# Patient Record
Sex: Male | Born: 1960 | Race: White | Hispanic: No | Marital: Married | Smoking: Never smoker
Health system: Southern US, Community
[De-identification: ages and names within clinical notes are randomized; demographics above are authoritative.]

## PROBLEM LIST (undated history)

## (undated) DIAGNOSIS — J329 Chronic sinusitis, unspecified: Secondary | ICD-10-CM

## (undated) DIAGNOSIS — K5720 Diverticulitis of large intestine with perforation and abscess without bleeding: Secondary | ICD-10-CM

## (undated) HISTORY — PX: RHINOPLASTY: SUR1284

## (undated) HISTORY — DX: Diverticulitis of large intestine with perforation and abscess without bleeding: K57.20

## (undated) HISTORY — PX: APPENDECTOMY: SHX54

## (undated) HISTORY — PX: INGUINAL HERNIA REPAIR: SUR1180

---

## 1997-08-18 ENCOUNTER — Other Ambulatory Visit: Admission: RE | Admit: 1997-08-18 | Discharge: 1997-08-18 | Payer: Self-pay | Admitting: Dermatology

## 1998-10-13 ENCOUNTER — Encounter (INDEPENDENT_AMBULATORY_CARE_PROVIDER_SITE_OTHER): Payer: Self-pay | Admitting: Specialist

## 1998-10-13 ENCOUNTER — Other Ambulatory Visit: Admission: RE | Admit: 1998-10-13 | Discharge: 1998-10-13 | Payer: Self-pay | Admitting: *Deleted

## 1999-02-14 HISTORY — PX: VASECTOMY: SHX75

## 2000-04-06 ENCOUNTER — Other Ambulatory Visit: Admission: RE | Admit: 2000-04-06 | Discharge: 2000-04-06 | Payer: Self-pay | Admitting: Urology

## 2000-04-06 ENCOUNTER — Encounter (INDEPENDENT_AMBULATORY_CARE_PROVIDER_SITE_OTHER): Payer: Self-pay | Admitting: Specialist

## 2002-07-25 ENCOUNTER — Encounter (INDEPENDENT_AMBULATORY_CARE_PROVIDER_SITE_OTHER): Payer: Self-pay | Admitting: *Deleted

## 2002-07-25 ENCOUNTER — Ambulatory Visit (HOSPITAL_BASED_OUTPATIENT_CLINIC_OR_DEPARTMENT_OTHER): Admission: RE | Admit: 2002-07-25 | Discharge: 2002-07-25 | Payer: Self-pay | Admitting: General Surgery

## 2003-10-27 ENCOUNTER — Encounter: Admission: RE | Admit: 2003-10-27 | Discharge: 2003-10-27 | Payer: Self-pay | Admitting: Internal Medicine

## 2007-08-18 ENCOUNTER — Encounter: Admission: RE | Admit: 2007-08-18 | Discharge: 2007-08-18 | Payer: Self-pay | Admitting: Orthopedic Surgery

## 2009-01-29 ENCOUNTER — Ambulatory Visit: Payer: Self-pay | Admitting: Diagnostic Radiology

## 2009-01-29 ENCOUNTER — Emergency Department (HOSPITAL_BASED_OUTPATIENT_CLINIC_OR_DEPARTMENT_OTHER): Admission: EM | Admit: 2009-01-29 | Discharge: 2009-01-29 | Payer: Self-pay | Admitting: Emergency Medicine

## 2010-05-11 ENCOUNTER — Other Ambulatory Visit: Payer: Self-pay | Admitting: Family Medicine

## 2010-05-11 ENCOUNTER — Ambulatory Visit
Admission: RE | Admit: 2010-05-11 | Discharge: 2010-05-11 | Disposition: A | Discharge status: Home / Self Care | Payer: BC Managed Care – PPO | Source: Ambulatory Visit | Attending: Family Medicine | Admitting: Family Medicine

## 2010-05-11 MED ORDER — IOHEXOL 300 MG/ML  SOLN
125.0000 mL | Freq: Once | INTRAMUSCULAR | Status: AC | PRN
  Administered 2010-05-11: 125 mL via INTRAVENOUS

## 2010-07-01 NOTE — Op Note (Signed)
Paul Sloan, Paul Sloan                   ACCOUNT NO.:  [id number]   MEDICAL RECORD NO.:  [id number]                   PATIENT TYPE:  AMB   LOCATION:  [facility]                                 FACILITY:  [facility]   PHYSICIAN:  Valetta Fuller, M.D.               DATE OF BIRTH:  04-19-1960   DATE OF PROCEDURE:  07/25/2002  DATE OF DISCHARGE:                                 OPERATIVE REPORT   PREOPERATIVE DIAGNOSIS:  Left spermatocele.   POSTOPERATIVE DIAGNOSIS:  Left spermatocele.   PROCEDURE:  Excision of left spermatocele.   SURGEON:  Valetta Fuller, M.D.   ASSISTANT:  Melvyn Novas, M.D.   ANESTHESIA:  General.   INDICATIONS FOR PROCEDURE:  The patient has had a diagnosis of a large  spermatocele for several years. He  initially came in and this was  evaluated. At this time it was relatively asymptomatic to him and we  recommended observation. He has felt that this has increased in size and is  causing him some discomfort on occasion. The patient has been diagnosed with  a recurrent inguina hernia and is seeing Dr. Leonie Man. He was to  undergo left hernia repair and wondered if the spermatocele could be excised  at the same time. We talked about  the pros and cons of this as well as the  potential complications and issues. He elected to have this done at the same  time.   DESCRIPTION OF PROCEDURE:  When we entered the operating room the patient  had had successful completion of his recurrent left inguinal hernia repair.  His scrotum was already prepped and draped in the usual manner.   A small incision in the median raphe was performed. The left scrotal  compartment was entered and a small  amount of hydrocele fluid was obtained.  The testicle was then brought out the incision. The testicle was palpably  normal. In the head of the epididymus was a 4 to 5-cm tense cystic mass with  cloudy fluid consistent with a spermatocele.   Utilizing a combination of blunt and  sharp dissective technique along with  electrocautery, we were able to separate this cyst from the surrounding  epididymus without difficulty. This was a large but unilocular cyst. Again  it contained approximately 20 to 25 cc of milky fluid. Hemostasis was quite  good. At the completion of the procedure the testicle was returned to the  hemiscrotum utilizing care to make sure it was not twisted. A spermatic cord  block was performed. The dartos was closed with some 3-0 Vicryl and the skin  was closed with a running 4-0 Vicryl.   The patient appeared  to tolerate  the procedure well. There were no obvious  complications.  Valetta Fuller, M.D.    DSG/MEDQ  D:  07/25/2002  T:  07/26/2002  Job:  [id number]

## 2010-07-01 NOTE — Op Note (Signed)
NAMEAARYN, [name]                   ACCOUNT NO.:  [id number]   MEDICAL RECORD NO.:  [id number]                   PATIENT TYPE:  AMB   LOCATION:  [facility]                                 FACILITY:  [facility]   PHYSICIAN:  Leonie Man, M.D.                DATE OF BIRTH:  1960-09-30   DATE OF PROCEDURE:  07/25/2002  DATE OF DISCHARGE:                                 OPERATIVE REPORT   PREOPERATIVE DIAGNOSIS:  Recurrent left inguinal hernia.   POSTOPERATIVE DIAGNOSIS:  Recurrent left inguinal hernia.   PROCEDURE:  Repair recurrent left inguinal hernia.   SURGEON:  Leonie Man, M.D.   ASSISTANT:  Joanne Gavel, M.D.   ANESTHESIA:  General.   INDICATIONS FOR PROCEDURE:  The patient is a 50 year old man who is status  post left inguinal hernia repair in the remote past using a Shouldice  technique. He has had a recurrence now for a few months and comes to the  operating room for repair after the risks and potential benefits of surgery  had been fully discussed  and consent obtained.   DESCRIPTION OF PROCEDURE:  Following induction of satisfactory general  anesthesia the patient was positioned supinely and the lower abdomen is  prepped and draped to be included in the sterile operative field. I  infiltrated in the left lower groin crease with 0.5% Marcaine with  epinephrine 1:200,000.   I made a transverse incision through the old scar cicatrix, deepening this  through the skin and subcutaneous tissues down through the external oblique  aponeurosis. The external oblique aponeurosis was opened up through the  external inguinal ring. The area felt to contain the ilioinguinal nerve was  protected and retracted laterally and inferiorly. The spermatic cord was  then dissected free from the surrounding tissues and held with a Penrose  drain.   Dissecting along the spermatic cord did not reveal an indirect hernia. There  was a very large direct hernia recurrence noted. The sutures  from the old  repair were removed, and the direct hernia was repaired with an onlay patch  of polypropylene mesh, zoning at the pubic tubercle  with a 2-0 Novofil  suture and then continued up on the conjoint tendon with a running suture of  2-0 Novofil up to the internal ring, and again from the pubic tubercle up  along the shelving edge of Poupart's ligament up to the internal ring with  the mesh being split so as to accommodate the cord protruding through the  split. The tails of the mesh were then trimmed and sutured into the internal  oblique muscle behind the cord.   The repair was then checked and noted to be intact. Sponge instrument and  sharp counts were verified. All areas of dissection were checked for  hemostatis and noted to be dry. The cord was returned to its normal anatomic  position and the external oblique aponeurosis was closed with a running  suture  of 2-0 Vicryl, thus reapproximating the external inguinal ring. The  subcutaneous tissues and Scarpa's fascia  were closed with a running suture  of 3-0 Vicryl and the skin was closed with a running 4-0 Monocryl. The wound  was then reinforced with Steri-Strips.   At this point Dr. Isabel Caprice entered the room as was planned prior to this to  perform a scrotal procedure for a spermatocele. That dictation will be in a  separate document.                                               Leonie Man, M.D.    PB/MEDQ  D:  07/25/2002  T:  07/26/2002  Job:  [id number]

## 2010-09-02 ENCOUNTER — Other Ambulatory Visit: Payer: Self-pay | Admitting: Gastroenterology

## 2011-06-18 ENCOUNTER — Encounter (HOSPITAL_COMMUNITY): Admission: EM | Disposition: A | Discharge status: Home / Self Care | Payer: Self-pay | Source: Home / Self Care

## 2011-06-18 ENCOUNTER — Emergency Department (HOSPITAL_COMMUNITY): Payer: BC Managed Care – PPO

## 2011-06-18 ENCOUNTER — Encounter (HOSPITAL_COMMUNITY): Payer: Self-pay

## 2011-06-18 ENCOUNTER — Encounter (HOSPITAL_COMMUNITY): Payer: Self-pay | Admitting: Anesthesiology

## 2011-06-18 ENCOUNTER — Inpatient Hospital Stay (HOSPITAL_COMMUNITY)
Admission: EM | Admit: 2011-06-18 | Discharge: 2011-06-23 | DRG: 883 | Disposition: A | Discharge status: Home / Self Care | Payer: BC Managed Care – PPO | Attending: General Surgery | Admitting: General Surgery

## 2011-06-18 ENCOUNTER — Emergency Department (HOSPITAL_COMMUNITY): Payer: BC Managed Care – PPO | Admitting: Anesthesiology

## 2011-06-18 DIAGNOSIS — R000 Tachycardia, unspecified: Secondary | ICD-10-CM | POA: Diagnosis present

## 2011-06-18 DIAGNOSIS — Z6831 Body mass index (BMI) 31.0-31.9, adult: Secondary | ICD-10-CM

## 2011-06-18 DIAGNOSIS — E669 Obesity, unspecified: Secondary | ICD-10-CM | POA: Diagnosis present

## 2011-06-18 DIAGNOSIS — K631 Perforation of intestine (nontraumatic): Secondary | ICD-10-CM

## 2011-06-18 DIAGNOSIS — K5720 Diverticulitis of large intestine with perforation and abscess without bleeding: Secondary | ICD-10-CM | POA: Diagnosis present

## 2011-06-18 DIAGNOSIS — K389 Disease of appendix, unspecified: Secondary | ICD-10-CM

## 2011-06-18 DIAGNOSIS — K37 Unspecified appendicitis: Principal | ICD-10-CM | POA: Diagnosis present

## 2011-06-18 DIAGNOSIS — K5732 Diverticulitis of large intestine without perforation or abscess without bleeding: Secondary | ICD-10-CM | POA: Diagnosis present

## 2011-06-18 HISTORY — PX: LAPAROSCOPY: SHX197

## 2011-06-18 LAB — URINE MICROSCOPIC-ADD ON

## 2011-06-18 LAB — URINALYSIS, ROUTINE W REFLEX MICROSCOPIC
Protein, ur: NEGATIVE mg/dL
Specific Gravity, Urine: 1.014 (ref 1.005–1.030)
Urobilinogen, UA: 0.2 mg/dL (ref 0.0–1.0)

## 2011-06-18 LAB — COMPREHENSIVE METABOLIC PANEL
AST: 19 U/L (ref 0–37)
Alkaline Phosphatase: 75 U/L (ref 39–117)
CO2: 23 mEq/L (ref 19–32)
Calcium: 9.5 mg/dL (ref 8.4–10.5)
Chloride: 94 mEq/L — ABNORMAL LOW (ref 96–112)
Glucose, Bld: 123 mg/dL — ABNORMAL HIGH (ref 70–99)
Potassium: 3.4 mEq/L — ABNORMAL LOW (ref 3.5–5.1)
Sodium: 130 mEq/L — ABNORMAL LOW (ref 135–145)
Total Protein: 7.6 g/dL (ref 6.0–8.3)

## 2011-06-18 LAB — CBC
HCT: 43.3 % (ref 39.0–52.0)
Hemoglobin: 15.2 g/dL (ref 13.0–17.0)
RBC: 4.8 MIL/uL (ref 4.22–5.81)
WBC: 22.7 10*3/uL — ABNORMAL HIGH (ref 4.0–10.5)

## 2011-06-18 SURGERY — LAPAROSCOPY, DIAGNOSTIC
Anesthesia: General | Site: Abdomen | Wound class: Dirty or Infected

## 2011-06-18 MED ORDER — HYDROMORPHONE HCL PF 1 MG/ML IJ SOLN
1.0000 mg | Freq: Once | INTRAMUSCULAR | Status: AC
  Administered 2011-06-18: 1 mg via INTRAVENOUS
  Filled 2011-06-18: qty 1

## 2011-06-18 MED ORDER — MIDAZOLAM HCL 5 MG/5ML IJ SOLN
INTRAMUSCULAR | Status: DC | PRN
  Administered 2011-06-18: 2 mg via INTRAVENOUS

## 2011-06-18 MED ORDER — FENTANYL CITRATE 0.05 MG/ML IJ SOLN
INTRAMUSCULAR | Status: DC | PRN
  Administered 2011-06-18: 50 ug via INTRAVENOUS
  Administered 2011-06-18: 100 ug via INTRAVENOUS

## 2011-06-18 MED ORDER — LACTATED RINGERS IV SOLN
INTRAVENOUS | Status: DC | PRN
  Administered 2011-06-18 (×2): via INTRAVENOUS

## 2011-06-18 MED ORDER — BUPIVACAINE HCL (PF) 0.5 % IJ SOLN
INTRAMUSCULAR | Status: AC
  Filled 2011-06-18: qty 30

## 2011-06-18 MED ORDER — PROPOFOL 10 MG/ML IV EMUL
INTRAVENOUS | Status: DC | PRN
  Administered 2011-06-18: 200 mg via INTRAVENOUS

## 2011-06-18 MED ORDER — BUPIVACAINE HCL (PF) 0.5 % IJ SOLN
INTRAMUSCULAR | Status: DC | PRN
  Administered 2011-06-18: 30 mL

## 2011-06-18 MED ORDER — IOHEXOL 300 MG/ML  SOLN
100.0000 mL | Freq: Once | INTRAMUSCULAR | Status: AC | PRN
  Administered 2011-06-18: 100 mL via INTRAVENOUS

## 2011-06-18 MED ORDER — ONDANSETRON HCL 4 MG/2ML IJ SOLN
4.0000 mg | Freq: Once | INTRAMUSCULAR | Status: AC
  Administered 2011-06-18: 4 mg via INTRAVENOUS
  Filled 2011-06-18: qty 2

## 2011-06-18 MED ORDER — SODIUM CHLORIDE 0.9 % IV SOLN
500.0000 mg | Freq: Once | INTRAVENOUS | Status: AC
  Administered 2011-06-18: 500 mg via INTRAVENOUS
  Filled 2011-06-18: qty 500

## 2011-06-18 MED ORDER — CISATRACURIUM BESYLATE 2 MG/ML IV SOLN
INTRAVENOUS | Status: DC | PRN
  Administered 2011-06-18: 4 mg via INTRAVENOUS
  Administered 2011-06-18: 6 mg via INTRAVENOUS
  Administered 2011-06-19: 3 mg via INTRAVENOUS

## 2011-06-18 MED ORDER — LACTATED RINGERS IV SOLN
INTRAVENOUS | Status: DC | PRN
  Administered 2011-06-18: 1000 mL via INTRAVENOUS

## 2011-06-18 MED ORDER — SUCCINYLCHOLINE CHLORIDE 20 MG/ML IJ SOLN
INTRAMUSCULAR | Status: DC | PRN
  Administered 2011-06-18: 100 mg via INTRAVENOUS

## 2011-06-18 SURGICAL SUPPLY — 39 items
BAG RETRIEVAL 10 (BASKET) ×1
BENZOIN TINCTURE PRP APPL 2/3 (GAUZE/BANDAGES/DRESSINGS) ×2 IMPLANT
CANISTER SUCTION 2500CC (MISCELLANEOUS) ×2 IMPLANT
CANNULA ENDOPATH XCEL 11M (ENDOMECHANICALS) IMPLANT
CLOTH BEACON ORANGE TIMEOUT ST (SAFETY) ×2 IMPLANT
DECANTER SPIKE VIAL GLASS SM (MISCELLANEOUS) IMPLANT
DEVICE TROCAR PUNCTURE CLOSURE (ENDOMECHANICALS) ×2 IMPLANT
DRAIN CHANNEL 19F RND (DRAIN) ×4 IMPLANT
DRAPE LAPAROSCOPIC ABDOMINAL (DRAPES) ×2 IMPLANT
DRSG TEGADERM 2-3/8X2-3/4 SM (GAUZE/BANDAGES/DRESSINGS) ×6 IMPLANT
ELECT REM PT RETURN 9FT ADLT (ELECTROSURGICAL) ×2
ELECTRODE REM PT RTRN 9FT ADLT (ELECTROSURGICAL) ×1 IMPLANT
EVACUATOR SILICONE 100CC (DRAIN) ×4 IMPLANT
GAUZE SPONGE 2X2 8PLY STRL LF (GAUZE/BANDAGES/DRESSINGS) ×1 IMPLANT
GLOVE BIOGEL PI IND STRL 7.0 (GLOVE) ×1 IMPLANT
GLOVE BIOGEL PI INDICATOR 7.0 (GLOVE) ×1
GLOVE ECLIPSE 8.0 STRL XLNG CF (GLOVE) ×2 IMPLANT
GLOVE INDICATOR 8.0 STRL GRN (GLOVE) ×4 IMPLANT
GOWN STRL NON-REIN LRG LVL3 (GOWN DISPOSABLE) ×2 IMPLANT
GOWN STRL REIN XL XLG (GOWN DISPOSABLE) ×4 IMPLANT
KIT BASIN OR (CUSTOM PROCEDURE TRAY) ×2 IMPLANT
SCALPEL HARMONIC ACE (MISCELLANEOUS) ×2 IMPLANT
SET IRRIG TUBING LAPAROSCOPIC (IRRIGATION / IRRIGATOR) ×2 IMPLANT
SOLUTION ANTI FOG 6CC (MISCELLANEOUS) ×2 IMPLANT
SPONGE DRAIN TRACH 4X4 STRL 2S (GAUZE/BANDAGES/DRESSINGS) ×2 IMPLANT
SPONGE GAUZE 2X2 STER 10/PKG (GAUZE/BANDAGES/DRESSINGS) ×1
STRIP CLOSURE SKIN 1/2X4 (GAUZE/BANDAGES/DRESSINGS) ×2 IMPLANT
SUT ETHILON 3 0 PS 1 (SUTURE) ×4 IMPLANT
SUT MNCRL AB 4-0 PS2 18 (SUTURE) ×2 IMPLANT
SUT VIC AB 4-0 PS2 27 (SUTURE) ×2 IMPLANT
SYS BAG RETRIEVAL 10MM (BASKET) ×1
SYSTEM BAG RETRIEVAL 10MM (BASKET) ×1 IMPLANT
TOWEL OR 17X26 10 PK STRL BLUE (TOWEL DISPOSABLE) ×2 IMPLANT
TRAY FOLEY CATH 14FRSI W/METER (CATHETERS) IMPLANT
TRAY LAP CHOLE (CUSTOM PROCEDURE TRAY) ×2 IMPLANT
TROCAR BLADELESS OPT 5 75 (ENDOMECHANICALS) ×8 IMPLANT
TROCAR XCEL BLUNT TIP 100MML (ENDOMECHANICALS) ×2 IMPLANT
TROCAR XCEL NON-BLD 11X100MML (ENDOMECHANICALS) IMPLANT
TUBING INSUFFLATION 10FT LAP (TUBING) ×2 IMPLANT

## 2011-06-18 NOTE — ED Notes (Signed)
Pt in from home with abd pain was seen at [facility] walk in and was referred to r/o appendicitis pt states pain onset two days ago denies vomiting at present states some nausea

## 2011-06-18 NOTE — ED Notes (Signed)
Pt transported to OR by Gerilyn Pilgrim NT

## 2011-06-18 NOTE — ED Provider Notes (Signed)
History     CSN: [id number]  Arrival date & time 06/18/11  1812   First MD Initiated Contact with Patient 06/18/11 1849      Chief Complaint  Patient presents with  . Abdominal Pain    (Consider location/radiation/quality/duration/timing/severity/associated sxs/prior treatment) The history is provided by the patient, the spouse and medical records.  Healthy 51 y/o M presents to ED after referral from [facility] walk-in clinic with c/c of acute onset RLQ pain 2 days ago. Pain severe, non-radiating, sharp. Assoc with fever up to 102, N/V, anorexia. Denies change in bowel habits. Prior abd surgeries include multiple inguinal hernia repairs. Pt was found at walk-in clinic to have WBC count >23k and was advised to present to ED to r/o appendicitis. Palpation and movement make pain worse, nothing makes it better.  History reviewed. No pertinent past medical history.  History reviewed. No pertinent past surgical history.  No family history on file.  History  Substance Use Topics  . Smoking status: Never Smoker   . Smokeless tobacco: Not on file  . Alcohol Use: Yes      Review of Systems 10 systems reviewed and are negative for acute change except as noted in the HPI.  Allergies  Review of patient's allergies indicates no known allergies.  Home Medications   Current Outpatient Rx  Name Route Sig Dispense Refill  . LORATADINE 10 MG PO CAPS Oral Take 10 mg by mouth daily.      BP 149/85  Pulse 119  Temp(Src) 99.7 F (37.6 C) (Oral)  Resp 16  SpO2 97%  Physical Exam  Constitutional: He is oriented to person, place, and time. He appears well-developed and well-nourished.       VS reviewed, sig for tachycardia. Pt uncomfortable appearing  HENT:  Head: Normocephalic and atraumatic.  Mouth/Throat: Oropharynx is clear and moist.  Eyes: Conjunctivae are normal. Pupils are equal, round, and reactive to light.  Neck: Neck supple.  Cardiovascular: Regular rhythm and normal heart  sounds.  Tachycardia present.   Pulmonary/Chest: Effort normal and breath sounds normal. No respiratory distress. He has no wheezes. He exhibits no tenderness.  Abdominal: Soft. Bowel sounds are normal. There is no rebound.       Obese, soft, TTP RLQ, referred pain to RLQ with palpation of the LLQ  Musculoskeletal: He exhibits no edema and no tenderness.  Neurological: He is alert and oriented to person, place, and time.  Skin: Skin is warm and dry.    ED Course  Procedures (including critical care time)   Labs Reviewed  CBC  COMPREHENSIVE METABOLIC PANEL  URINALYSIS, ROUTINE W REFLEX MICROSCOPIC   No results found.   No diagnosis found.    MDM  7:05 PM Pt has been seen and evaluated. Hx and PE concerning for acute appendicitis. CT scan, labs ordered. Pain/nausea medication ordered. Will continue to monitor.      8:09 PM Pt care has been discussed with NP Manus Rudd, who will continue to monitor and follow-up on testing results.  Shaaron Adler, PA-C 06/18/11 2010

## 2011-06-18 NOTE — H&P (Addendum)
Paul Sloan is an 51 y.o. male.   Chief Complaint: Abdominal pain and fever HPI: He was in his normal state of health until yesterday after lunch when he developed the sudden onset of lower abdominal pain.  The pain was persistent and was associated with a fever as high as 102. He presented to the walk-in clinic and there was concern for acute appendicitis as he had a fever of 101 and an elevation of his white blood cell count. He was sent to the emergency department where he underwent a CT scan that demonstrated significant amount of free intraperitoneal air as well as significant inflammatory changes of the sigmoid colon consistent with diverticulitis with perforation. The appendix was dilated as well with inflammatory changes around it  felt to be secondary inflammatory changes. There is a small amount of free fluid around the sigmoid colon. I was asked to see him at this time. He's been started on IV Primaxin. He does have a previous history of diverticulitis.  PMH:  Sigmoid diverticulitis  PSH: BIH repair   No family history on file. Social History:  reports that he has never smoked. He does not have any smokeless tobacco history on file. He reports that he drinks alcohol. His drug history not on file.  Allergies: No Known Allergies  Prior to Admission medications   Medication Sig Start Date End Date Taking? Authorizing Provider  Loratadine (CLARITIN) 10 MG CAPS Take 10 mg by mouth daily.   Yes Historical Provider, MD     (Not in a hospital admission)  Results for orders placed during the hospital encounter of 06/18/11 (from the past 48 hour(s))  CBC     Status: Abnormal   Collection Time   06/18/11  7:45 PM      Component Value Range Comment   WBC 22.7 (*) 4.0 - 10.5 (K/uL)    RBC 4.80  4.22 - 5.81 (MIL/uL)    Hemoglobin 15.2  13.0 - 17.0 (g/dL)    HCT 16.1  09.6 - 04.5 (%)    MCV 90.2  78.0 - 100.0 (fL)    MCH 31.7  26.0 - 34.0 (pg)    MCHC 35.1  30.0 - 36.0 (g/dL)      RDW 40.9  81.1 - 15.5 (%)    Platelets 267  150 - 400 (K/uL)   COMPREHENSIVE METABOLIC PANEL     Status: Abnormal   Collection Time   06/18/11  7:45 PM      Component Value Range Comment   Sodium 130 (*) 135 - 145 (mEq/L)    Potassium 3.4 (*) 3.5 - 5.1 (mEq/L)    Chloride 94 (*) 96 - 112 (mEq/L)    CO2 23  19 - 32 (mEq/L)    Glucose, Bld 123 (*) 70 - 99 (mg/dL)    BUN 9  6 - 23 (mg/dL)    Creatinine, Ser 9.14  0.50 - 1.35 (mg/dL)    Calcium 9.5  8.4 - 10.5 (mg/dL)    Total Protein 7.6  6.0 - 8.3 (g/dL)    Albumin 3.9  3.5 - 5.2 (g/dL)    AST 19  0 - 37 (U/L)    ALT 31  0 - 53 (U/L)    Alkaline Phosphatase 75  39 - 117 (U/L)    Total Bilirubin 1.7 (*) 0.3 - 1.2 (mg/dL)    GFR calc non Af Amer >90  >90 (mL/min)    GFR calc Af Amer >90  >90 (mL/min)  URINALYSIS, ROUTINE W REFLEX MICROSCOPIC     Status: Abnormal   Collection Time   06/18/11  8:13 PM      Component Value Range Comment   Color, Urine YELLOW  YELLOW     APPearance CLEAR  CLEAR     Specific Gravity, Urine 1.014  1.005 - 1.030     pH 6.5  5.0 - 8.0     Glucose, UA NEGATIVE  NEGATIVE (mg/dL)    Hgb urine dipstick SMALL (*) NEGATIVE     Bilirubin Urine NEGATIVE  NEGATIVE     Ketones, ur TRACE (*) NEGATIVE (mg/dL)    Protein, ur NEGATIVE  NEGATIVE (mg/dL)    Urobilinogen, UA 0.2  0.0 - 1.0 (mg/dL)    Nitrite NEGATIVE  NEGATIVE     Leukocytes, UA NEGATIVE  NEGATIVE    URINE MICROSCOPIC-ADD ON     Status: Normal   Collection Time   06/18/11  8:13 PM      Component Value Range Comment   RBC / HPF 0-2  <3 (RBC/hpf)    Ct Abdomen Pelvis W Contrast  06/18/2011  *RADIOLOGY REPORT*  Clinical Data: Acute onset of right lower quadrant abdominal pain, fever and vomiting; significant leukocytosis.  CT ABDOMEN AND PELVIS WITH CONTRAST  Technique:  Multidetector CT imaging of the abdomen and pelvis was performed following the standard protocol during bolus administration of intravenous contrast.  Contrast: OMNIPAQUE IOHEXOL 300  MG/ML  SOLN  Comparison: CT of the abdomen and pelvis performed 05/11/2010  Findings: The visualized lung bases are clear.  There is a significant amount of free air noted within the abdomen, predominantly anteriorly and tracking about the liver and stomach, though minimal scattered air is seen along the mesentery and omentum.  This arises from a perforated diverticulitis; a significant amount of soft tissue inflammation is noted about the proximal to mid sigmoid colon, with minimal associated fluid.  Associated soft tissue inflammation and trace free air tracks superiorly along the mesentery and adjacent small bowel loops.  The liver and spleen are unremarkable in appearance.  A few tiny stones are seen layering dependently within the gallbladder; the gallbladder is otherwise unremarkable in appearance.  The pancreas and adrenal glands are unremarkable.  Nonspecific perinephric stranding is noted bilaterally.  A small 1.5 cm exophytic cyst at the upper pole of the right kidney appears stable from 2012.  The kidneys are otherwise grossly unremarkable in appearance.  There is no evidence of hydronephrosis.  No renal or ureteral stones are seen.  No free fluid is identified.  The small bowel is unremarkable in appearance.  The stomach is within normal limits.  No acute vascular abnormalities are seen.  Minimal calcification is noted at the aortic bifurcation.  The appendix is dilated to 1.0 cm in diameter, with mild surrounding soft tissue inflammation.  However, the location of the free air and the predominant location of the soft tissue inflammation suggest this reflects a diverticulitis rather than an appendicitis.  Diffuse diverticulosis involves the descending and sigmoid colon.  The bladder is moderately distended and grossly unremarkable in appearance.  The prostate is borderline normal in size.  No inguinal lymphadenopathy is seen.  No acute osseous abnormalities are identified.  IMPRESSION:  1.  Significant  free air noted within the abdomen.  This most likely arises from a perforated diverticulitis, with soft tissue inflammation noted about the proximal to the sigmoid colon, and minimal associated free fluid.  Associated soft tissue inflammation and trace free  air tracks superiorly along the mesentery and adjacent small bowel loops. 2.  Mild inflammation and dilatation of the appendix is thought to be reactive in nature, given the predominant location of the soft tissue inflammation and free air about the sigmoid colon. 3.  Diffuse diverticulosis involves the descending and sigmoid colon. 4.  Minimal calcification at the aortic bifurcation. 5.  Mild cholelithiasis noted; gallbladder otherwise unremarkable in appearance. 6.  Small stable right renal cyst noted.  Critical Value/emergent results were called by telephone at the time of interpretation on 06/18/2011  at 09:26 p.m.  to  [facility] PA, who verbally acknowledged these results.  Original Report Authenticated By: Tonia Ghent, M.D.    Review of Systems  Constitutional: Positive for fever and chills.  HENT: Positive for congestion. Negative for sore throat.   Respiratory: Negative for cough and shortness of breath.   Cardiovascular: Negative for chest pain.  Gastrointestinal: Positive for abdominal pain. Negative for heartburn, nausea, vomiting, diarrhea, constipation and blood in stool.  Genitourinary: Positive for dysuria. Negative for hematuria.  Neurological: Negative.   Endo/Heme/Allergies: Negative.     Blood pressure 149/85, pulse 119, temperature 99.7 F (37.6 C), temperature source Oral, resp. rate 16, SpO2 97.00%. Physical Exam  Constitutional:       Overweight ill appearing male  HENT:  Head: Normocephalic and atraumatic.  Eyes: No scleral icterus.  Neck: Neck supple.  Cardiovascular:       Increased rate with regular rhythm  Respiratory: Effort normal and breath sounds normal.  GI: He exhibits distension. There is  tenderness. There is rebound and guarding.       No bowel sounds heard  Musculoskeletal: He exhibits no edema.  Lymphadenopathy:    He has no cervical adenopathy.  Neurological: He is alert.  Skin: Skin is warm and dry.     Assessment/Plan Sigmoid diverticulitis with perforation and secondary inflammatory changes involving the appendix.  Plan: To the operating room for laparoscopy with possible washout and drainage if no significant contamination an active leak is noted. If he does have significant contamination however we'll proceed with exploratory laparotomy, partial colectomy, colostomy.  We will likely perform an appendectomy either way. I told him if we try the laparoscopic procedure with drainage and it fails then he may need an exploratory laparotomy during his hospitalization with a partial colectomy and colostomy.  I have explained the procedure and risks of colon resection.  Risks include but are not limited to bleeding, infection, wound problems, anesthesia, colostomy problems, injury to intraabominal organs (such as intestine, spleen, kidney, bladder, ureter, etc.), ileus.  He seems to understand and agrees with the plan.  Nada Godley J 06/18/2011, 10:25 PM

## 2011-06-18 NOTE — Anesthesia Preprocedure Evaluation (Addendum)
Anesthesia Evaluation  Patient identified by MRN, date of birth, ID band Patient awake    Reviewed: Allergy & Precautions, H&P , NPO status , Patient's Chart, lab work & pertinent test results  Airway Mallampati: III TM Distance: >3 FB Neck ROM: Full    Dental  (+) Teeth Intact and Dental Advisory Given   Pulmonary neg pulmonary ROS,  breath sounds clear to auscultation  Pulmonary exam normal       Cardiovascular negative cardio ROS  Rhythm:Regular Rate:Normal     Neuro/Psych negative neurological ROS  negative psych ROS   GI/Hepatic negative GI ROS, Neg liver ROS,   Endo/Other  Morbid obesity  Renal/GU negative Renal ROS  negative genitourinary   Musculoskeletal negative musculoskeletal ROS (+)   Abdominal (+) + obese,  Abdomen: tender.    Peds  Hematology negative hematology ROS (+)   Anesthesia Other Findings   Reproductive/Obstetrics negative OB ROS                           Anesthesia Physical Anesthesia Plan  ASA: II and Emergent  Anesthesia Plan: General   Post-op Pain Management:    Induction: Intravenous, Rapid sequence and Cricoid pressure planned  Airway Management Planned: Oral ETT  Additional Equipment:   Intra-op Plan:   Post-operative Plan: Extubation in OR  Informed Consent: I have reviewed the patients History and Physical, chart, labs and discussed the procedure including the risks, benefits and alternatives for the proposed anesthesia with the patient or authorized representative who has indicated his/her understanding and acceptance.   Dental advisory given  Plan Discussed with: CRNA  Anesthesia Plan Comments:         Anesthesia Quick Evaluation

## 2011-06-19 ENCOUNTER — Encounter (HOSPITAL_COMMUNITY): Payer: Self-pay

## 2011-06-19 DIAGNOSIS — K5720 Diverticulitis of large intestine with perforation and abscess without bleeding: Secondary | ICD-10-CM | POA: Diagnosis present

## 2011-06-19 HISTORY — PX: OTHER SURGICAL HISTORY: SHX169

## 2011-06-19 LAB — CBC
HCT: 40.9 % (ref 39.0–52.0)
MCV: 90.9 fL (ref 78.0–100.0)
RBC: 4.5 MIL/uL (ref 4.22–5.81)
WBC: 23.1 10*3/uL — ABNORMAL HIGH (ref 4.0–10.5)

## 2011-06-19 LAB — BASIC METABOLIC PANEL
CO2: 23 mEq/L (ref 19–32)
Chloride: 102 mEq/L (ref 96–112)
Sodium: 135 mEq/L (ref 135–145)

## 2011-06-19 MED ORDER — PANTOPRAZOLE SODIUM 40 MG IV SOLR
40.0000 mg | Freq: Every day | INTRAVENOUS | Status: DC
  Administered 2011-06-19 – 2011-06-22 (×5): 40 mg via INTRAVENOUS
  Filled 2011-06-19 (×6): qty 40

## 2011-06-19 MED ORDER — CHLORHEXIDINE GLUCONATE 0.12 % MT SOLN
15.0000 mL | Freq: Two times a day (BID) | OROMUCOSAL | Status: DC
  Administered 2011-06-19 – 2011-06-22 (×7): 15 mL via OROMUCOSAL
  Filled 2011-06-19 (×11): qty 15

## 2011-06-19 MED ORDER — ONDANSETRON HCL 4 MG PO TABS: 4.0000 mg | ORAL_TABLET | Freq: Four times a day (QID) | ORAL | Status: DC | PRN

## 2011-06-19 MED ORDER — MORPHINE SULFATE 2 MG/ML IJ SOLN
2.0000 mg | INTRAMUSCULAR | Status: DC | PRN
  Administered 2011-06-19 – 2011-06-21 (×6): 2 mg via INTRAVENOUS
  Filled 2011-06-19 (×6): qty 1

## 2011-06-19 MED ORDER — BIOTENE DRY MOUTH MT LIQD
15.0000 mL | Freq: Two times a day (BID) | OROMUCOSAL | Status: DC
  Administered 2011-06-19 – 2011-06-22 (×7): 15 mL via OROMUCOSAL

## 2011-06-19 MED ORDER — LACTATED RINGERS IV SOLN: INTRAVENOUS | Status: DC

## 2011-06-19 MED ORDER — DEXAMETHASONE SODIUM PHOSPHATE 10 MG/ML IJ SOLN
INTRAMUSCULAR | Status: DC | PRN
  Administered 2011-06-18: 10 mg via INTRAVENOUS

## 2011-06-19 MED ORDER — ONDANSETRON HCL 4 MG/2ML IJ SOLN: 4.0000 mg | Freq: Four times a day (QID) | INTRAMUSCULAR | Status: DC | PRN

## 2011-06-19 MED ORDER — GLYCOPYRROLATE 0.2 MG/ML IJ SOLN
INTRAMUSCULAR | Status: DC | PRN
  Administered 2011-06-19: 0.6 mg via INTRAVENOUS

## 2011-06-19 MED ORDER — SODIUM CHLORIDE 0.9 % IV SOLN
1.0000 g | Freq: Every day | INTRAVENOUS | Status: DC
  Administered 2011-06-19 – 2011-06-22 (×5): 1 g via INTRAVENOUS
  Filled 2011-06-19 (×6): qty 1

## 2011-06-19 MED ORDER — FENTANYL CITRATE 0.05 MG/ML IJ SOLN
INTRAMUSCULAR | Status: AC
  Administered 2011-06-19: 25 ug via INTRAVENOUS
  Filled 2011-06-19: qty 2

## 2011-06-19 MED ORDER — KCL IN DEXTROSE-NACL 20-5-0.9 MEQ/L-%-% IV SOLN
INTRAVENOUS | Status: DC
  Administered 2011-06-19 – 2011-06-20 (×4): via INTRAVENOUS
  Administered 2011-06-20: 125 mL via INTRAVENOUS
  Administered 2011-06-20 – 2011-06-21 (×2): via INTRAVENOUS
  Administered 2011-06-21: 125 mL via INTRAVENOUS
  Administered 2011-06-22: 22:00:00 via INTRAVENOUS
  Administered 2011-06-22: 125 mL via INTRAVENOUS
  Filled 2011-06-19 (×13): qty 1000

## 2011-06-19 MED ORDER — ONDANSETRON HCL 4 MG/2ML IJ SOLN
INTRAMUSCULAR | Status: DC | PRN
  Administered 2011-06-19: 4 mg via INTRAVENOUS

## 2011-06-19 MED ORDER — PROMETHAZINE HCL 25 MG/ML IJ SOLN: 6.2500 mg | INTRAMUSCULAR | Status: DC | PRN

## 2011-06-19 MED ORDER — LACTATED RINGERS IR SOLN
Status: DC | PRN
  Administered 2011-06-19: 3000 mL

## 2011-06-19 MED ORDER — NEOSTIGMINE METHYLSULFATE 1 MG/ML IJ SOLN
INTRAMUSCULAR | Status: DC | PRN
  Administered 2011-06-19 (×2): 5 mg via INTRAVENOUS

## 2011-06-19 MED ORDER — HEPARIN SODIUM (PORCINE) 5000 UNIT/ML IJ SOLN
5000.0000 [IU] | Freq: Three times a day (TID) | INTRAMUSCULAR | Status: DC
  Administered 2011-06-19 – 2011-06-23 (×12): 5000 [IU] via SUBCUTANEOUS
  Filled 2011-06-19 (×15): qty 1

## 2011-06-19 MED ORDER — MENTHOL 3 MG MT LOZG
1.0000 | LOZENGE | OROMUCOSAL | Status: DC | PRN
  Filled 2011-06-19: qty 9

## 2011-06-19 MED ORDER — FENTANYL CITRATE 0.05 MG/ML IJ SOLN
25.0000 ug | INTRAMUSCULAR | Status: DC | PRN
  Administered 2011-06-19 (×2): 25 ug via INTRAVENOUS

## 2011-06-19 MED ORDER — KCL IN DEXTROSE-NACL 20-5-0.9 MEQ/L-%-% IV SOLN
INTRAVENOUS | Status: AC
  Filled 2011-06-19: qty 1000

## 2011-06-19 MED FILL — Cisatracurium Besylate (PF) IV Soln 10 MG/5ML (2 MG/ML): INTRAVENOUS | Qty: 10 | Status: AC

## 2011-06-19 NOTE — Op Note (Signed)
Operative Note  Paul Sloan male 51 y.o. 06/19/2011  PREOPERATIVE AV:[id number] sigmoid diverticulitis with periappendicitis  POSTOPERATIVE DX:  Same  PROCEDURE:Laparoscopic exploration and irrigation of the abdominal cavity with appendectomy and placement of drains         Surgeon: Adolph Pollack   Assistants: None  Anesthesia: General endotracheal anesthesia  Indications: This is a 51 year old male who presented to the emergency department with abdominal pain and was found to have perforated sigmoid diverticulitis. He also had some inflammation of the appendix which is felt to be periappendicitis. He is now brought to the operating room for laparoscopy with possible exploratory laparotomy, colectomy, colostomy, and appendectomy. The procedure and risks were explained to him preoperatively.    Procedure Detail:  He was brought to the operating room placed supine on the operating table and a general anesthetic was administered. A nasogastric tube and Foley catheter were placed. The hair on the abdominal wall was clipped. The abdominal wall was sterilely prepped and draped.  He was placed in slight reverse Trendelenburg position. A 5 mm incision was made in the left upper quadrant subcostal region. A 5 mm Optiview trocar with a 5 mm laparoscope was then placed through this incision and into the peritoneal cavity. A pneumoperitoneum was created. Inspection of the area under the trocar demonstrated no evidence of bleeding or organ injury.  I visualized the lower abdomen and noted some fibrinous type adhesions present. A 5 mm trocar was placed through an epigastric incision and one also was placed in the right upper quadrant.  A 5 mm trocar was placed in the left lower quadrant. I broke up some of the inflammatory adhesions and noted a very inflamed segment of sigmoid colon crossing the midline going to the right lower quadrant area. There is no feculent drainage. There is a small  amount of purulent fluid around this area but not diffuse peritonitis. I manipulated this part of the sigmoid colon and could not see an active leak. I identified the appendix and noted inflammatory changes around the appendix. I decided to remove the appendix.  The 5 mm epigastric trocar was replaced with a 12 mm trocar.The mesentery of the appendix was divided down to the base of the appendix with harmonic scalpel. The appendix was amputated off the cecum with the GIA stapler. It was placed into a retrieval bag.  The appendix was removed in the retrieval bag through the epigastric trocar site and the epigastric trocar was replaced.  The appendectomy staple line was solid without evidence of bleeding. The entire abdominal cavity was then copiously irrigated.  A 19 Blake drain was placed into the abdominal cavity and brought out through the left lower quadrant trocar site. It was placed just superior to the area of the diverticulitis and then anchored to the skin with nylon suture.  A 5 mm trocar was placed in the right lower quadrant. A second drain was placed into the peritoneal cavity and brought out the right lower quadrant trocar site. This drain was placed inferior to the area of active inflammation in the sigmoid colon and into the pelvis. It was anchored to the skin with nylon suture.  A 4 quadrant inspection was performed and there is no evidence of organ injury or bleeding. The 12 mm trocar was removed and the fascial defect closed under laparoscopic vision with a 0 Vicryl suture. The remaining trocars were removed and the pneumoperitoneum was released.  The skin incisions were closed with 4-0 Monocryl subcuticular stitches  followed by Steri-Strips and sterile dressings. The drains were hooked to closed suction.  He tolerated the procedure well without any apparent complications and was taken to the recovery room in satisfactory condition.    Findings: Inflamed segment of sigmoid colon  without active bleed. No feculent contamination. Periappendicitis changes.  Estimated Blood Loss:  less than 100 mL         Drains: JACKSON-PRATT (JP) x 2          Blood Given: none          Specimens: Appendix        Complications:  * No complications entered in OR log *         Disposition: PACU - hemodynamically stable.         Condition: stable

## 2011-06-19 NOTE — Anesthesia Postprocedure Evaluation (Signed)
  Anesthesia Post-op Note  Patient: Paul Sloan  Procedure(s) Performed: Procedure(s) (LRB): LAPAROSCOPY DIAGNOSTIC (N/A)  Patient Location: PACU  Anesthesia Type: General  Level of Consciousness: awake, alert , oriented and patient cooperative  Airway and Oxygen Therapy: Patient Spontanous Breathing and Patient connected to nasal cannula oxygen  Post-op Pain: 2 /10  Post-op Assessment: Post-op Vital signs reviewed  Post-op Vital Signs: Reviewed and stable  Complications: No apparent anesthesia complications

## 2011-06-19 NOTE — Progress Notes (Signed)
UR complete 

## 2011-06-19 NOTE — Anesthesia Postprocedure Evaluation (Signed)
  Anesthesia Post-op Note  Patient: Paul Sloan  Procedure(s) Performed: Procedure(s) (LRB): LAPAROSCOPY DIAGNOSTIC (N/A)  Patient Location: PACU  Anesthesia Type: General  Level of Consciousness: awake, alert , oriented and patient cooperative  Airway and Oxygen Therapy: Patient Spontanous Breathing and Patient connected to nasal cannula oxygen  Post-op Pain: 2 /10  Post-op Assessment: Post-op Vital signs reviewed  Post-op Vital Signs: Reviewed and stable  Complications: No apparent anesthesia complications 

## 2011-06-19 NOTE — Progress Notes (Signed)
1 Day Post-Op  Subjective: Feels better than he did preop  Objective: Vital signs in last 24 hours: Temp:  [98.1 F (36.7 C)-99.7 F (37.6 C)] 98.4 F (36.9 C) (05/06 0555) Pulse Rate:  [94-119] 96  (05/06 0555) Resp:  [14-25] 20  (05/06 0555) BP: (116-149)/(74-103) 127/79 mmHg (05/06 0555) SpO2:  [93 %-98 %] 97 % (05/06 0555) Weight:  [245 lb (111.131 kg)-245 lb 6.3 oz (111.31 kg)] 245 lb 6.3 oz (111.31 kg) (05/06 0317) Last BM Date: 06/17/11  Intake/Output from previous day: 05/05 0701 - 05/06 0700 In: 2031 [I.V.:2000; NG/GT:30] Out: 3440 [Urine:3000; Emesis/NG output:100; Drains:340] Intake/Output this shift:    PE: Abd-soft, distended, quiet, serous drain output, dressings dry  Lab Results:   Basename 06/19/11 0403 06/18/11 1945  WBC 23.1* 22.7*  HGB 13.9 15.2  HCT 40.9 43.3  PLT 241 267   BMET  Basename 06/19/11 0403 06/18/11 1945  NA 135 130*  K 3.8 3.4*  CL 102 94*  CO2 23 23  GLUCOSE 151* 123*  BUN 8 9  CREATININE 1.00 0.98  CALCIUM 9.1 9.5   PT/INR No results found for this basename: LABPROT:2,INR:2 in the last 72 hours Comprehensive Metabolic Panel:    Component Value Date/Time   NA 135 06/19/2011 0403   K 3.8 06/19/2011 0403   CL 102 06/19/2011 0403   CO2 23 06/19/2011 0403   BUN 8 06/19/2011 0403   CREATININE 1.00 06/19/2011 0403   GLUCOSE 151* 06/19/2011 0403   CALCIUM 9.1 06/19/2011 0403   AST 19 06/18/2011 1945   ALT 31 06/18/2011 1945   ALKPHOS 75 06/18/2011 1945   BILITOT 1.7* 06/18/2011 1945   PROT 7.6 06/18/2011 1945   ALBUMIN 3.9 06/18/2011 1945     Studies/Results: Ct Abdomen Pelvis W Contrast  06/18/2011  *RADIOLOGY REPORT*  Clinical Data: Acute onset of right lower quadrant abdominal pain, fever and vomiting; significant leukocytosis.  CT ABDOMEN AND PELVIS WITH CONTRAST  Technique:  Multidetector CT imaging of the abdomen and pelvis was performed following the standard protocol during bolus administration of intravenous contrast.  Contrast:  OMNIPAQUE IOHEXOL 300 MG/ML  SOLN  Comparison: CT of the abdomen and pelvis performed 05/11/2010  Findings: The visualized lung bases are clear.  There is a significant amount of free air noted within the abdomen, predominantly anteriorly and tracking about the liver and stomach, though minimal scattered air is seen along the mesentery and omentum.  This arises from a perforated diverticulitis; a significant amount of soft tissue inflammation is noted about the proximal to mid sigmoid colon, with minimal associated fluid.  Associated soft tissue inflammation and trace free air tracks superiorly along the mesentery and adjacent small bowel loops.  The liver and spleen are unremarkable in appearance.  A few tiny stones are seen layering dependently within the gallbladder; the gallbladder is otherwise unremarkable in appearance.  The pancreas and adrenal glands are unremarkable.  Nonspecific perinephric stranding is noted bilaterally.  A small 1.5 cm exophytic cyst at the upper pole of the right kidney appears stable from 2012.  The kidneys are otherwise grossly unremarkable in appearance.  There is no evidence of hydronephrosis.  No renal or ureteral stones are seen.  No free fluid is identified.  The small bowel is unremarkable in appearance.  The stomach is within normal limits.  No acute vascular abnormalities are seen.  Minimal calcification is noted at the aortic bifurcation.  The appendix is dilated to 1.0 cm in diameter, with mild  surrounding soft tissue inflammation.  However, the location of the free air and the predominant location of the soft tissue inflammation suggest this reflects a diverticulitis rather than an appendicitis.  Diffuse diverticulosis involves the descending and sigmoid colon.  The bladder is moderately distended and grossly unremarkable in appearance.  The prostate is borderline normal in size.  No inguinal lymphadenopathy is seen.  No acute osseous abnormalities are identified.   IMPRESSION:  1.  Significant free air noted within the abdomen.  This most likely arises from a perforated diverticulitis, with soft tissue inflammation noted about the proximal to the sigmoid colon, and minimal associated free fluid.  Associated soft tissue inflammation and trace free air tracks superiorly along the mesentery and adjacent small bowel loops. 2.  Mild inflammation and dilatation of the appendix is thought to be reactive in nature, given the predominant location of the soft tissue inflammation and free air about the sigmoid colon. 3.  Diffuse diverticulosis involves the descending and sigmoid colon. 4.  Minimal calcification at the aortic bifurcation. 5.  Mild cholelithiasis noted; gallbladder otherwise unremarkable in appearance. 6.  Small stable right renal cyst noted.  Critical Value/emergent results were called by telephone at the time of interpretation on 06/18/2011  at 09:26 p.m.  to  [facility] PA, who verbally acknowledged these results.  Original Report Authenticated By: Tonia Ghent, M.D.   Dg Chest Port 1 View  06/18/2011  *RADIOLOGY REPORT*  Clinical Data: Abdominal pain; preoperative chest radiograph for perforated diverticulitis.  PORTABLE CHEST - 1 VIEW  Comparison: Chest radiograph performed 01/29/2009  Findings: The lungs are well-aerated.  Mild vascular congestion is noted, without definite evidence of pulmonary edema.  There is no evidence of focal opacification, pleural effusion or pneumothorax.  The cardiomediastinal silhouette is within normal limits.  No acute osseous abnormalities are seen.  IMPRESSION: Mild vascular congestion noted, without evidence of significant pulmonary edema.  Original Report Authenticated By: Tonia Ghent, M.D.    Anti-infectives: Anti-infectives     Start     Dose/Rate Route Frequency Ordered Stop   06/19/11 0315   ertapenem (INVANZ) 1 g in sodium chloride 0.9 % 50 mL IVPB        1 g 100 mL/hr over 30 Minutes Intravenous Daily at bedtime  06/19/11 0301     06/18/11 2200   imipenem-cilastatin (PRIMAXIN) 500 mg in sodium chloride 0.9 % 100 mL IVPB        500 mg 200 mL/hr over 30 Minutes Intravenous  Once 06/18/11 2146 06/18/11 2249          Assessment Principal Problem:  *Diverticulitis of colon with perforation s/p laparoscopic irrigation/appendectomy/drain placement 06/19/11-stable postop    LOS: 1 day   Plan: OOB.  Continue IVa abxs and bowel rest.  Partial colectomy with colostomy if his condition worsens.   Romain Erion J 06/19/2011

## 2011-06-19 NOTE — ED Provider Notes (Signed)
Medical screening examination/treatment/procedure(s) were conducted as a shared visit with non-physician practitioner(s) and myself.  I personally evaluated the patient during the encounter    Nelia Shi, MD 06/19/11 [id number]

## 2011-06-19 NOTE — Transfer of Care (Signed)
Immediate Anesthesia Transfer of Care Note  Patient: Paul Sloan  Procedure(s) Performed: Procedure(s) (LRB): LAPAROSCOPY DIAGNOSTIC (N/A)  Patient Location: PACU  Anesthesia Type: General  Level of Consciousness: awake, sedated and patient cooperative  Airway & Oxygen Therapy: Patient Spontanous Breathing and Patient connected to face mask oxygen  Post-op Assessment: Report given to PACU RN and Post -op Vital signs reviewed and stable  Post vital signs: Reviewed and stable  Complications: No apparent anesthesia complications

## 2011-06-20 LAB — CBC
HCT: 38.1 % — ABNORMAL LOW (ref 39.0–52.0)
Hemoglobin: 13 g/dL (ref 13.0–17.0)
RBC: 4.12 MIL/uL — ABNORMAL LOW (ref 4.22–5.81)
WBC: 18.8 10*3/uL — ABNORMAL HIGH (ref 4.0–10.5)

## 2011-06-20 MED ORDER — ACETAMINOPHEN 10 MG/ML IV SOLN
1000.0000 mg | Freq: Four times a day (QID) | INTRAVENOUS | Status: AC
  Administered 2011-06-20 – 2011-06-21 (×4): 1000 mg via INTRAVENOUS
  Filled 2011-06-20 (×6): qty 100

## 2011-06-20 NOTE — Progress Notes (Signed)
2 Days Post-Op  Subjective: Feels somewhat better, but not great.  No flatus, still pretty tender, not using much for pain.  Objective: Vital signs in last 24 hours: Temp:  [98 F (36.7 C)-99.4 F (37.4 C)] 99.4 F (37.4 C) (05/07 0150) Pulse Rate:  [89-96] 95  (05/07 0150) Resp:  [18-20] 20  (05/07 0150) BP: (130-143)/(79-85) 143/79 mmHg (05/07 0150) SpO2:  [96 %-99 %] 98 % (05/07 0150) Last BM Date: 06/17/11 950 ml from NG yesterday, I/O is negative, Tm 99.4, WBC improving    Intake/Output from previous day: 05/06 0701 - 05/07 0700 In: 1889.6 [I.V.:1889.6] Out: 4150 [Urine:3150; Emesis/NG output:950; Drains:50] Intake/Output this shift:    General appearance: alert, cooperative and no distress Resp: clear to auscultation bilaterally GI: soft, hypoactive bowel sounds, allot still coming from NG, no flatus.  Drains show clear serous fluid.  Lab Results:   Basename 06/20/11 0423 06/19/11 0403  WBC 18.8* 23.1*  HGB 13.0 13.9  HCT 38.1* 40.9  PLT 264 241    BMET  Basename 06/19/11 0403 06/18/11 1945  NA 135 130*  K 3.8 3.4*  CL 102 94*  CO2 23 23  GLUCOSE 151* 123*  BUN 8 9  CREATININE 1.00 0.98  CALCIUM 9.1 9.5   PT/INR No results found for this basename: LABPROT:2,INR:2 in the last 72 hours   Lab 06/18/11 1945  AST 19  ALT 31  ALKPHOS 75  BILITOT 1.7*  PROT 7.6  ALBUMIN 3.9     Lipase  No results found for this basename: lipase     Studies/Results: Ct Abdomen Pelvis W Contrast  06/18/2011  *RADIOLOGY REPORT*  Clinical Data: Acute onset of right lower quadrant abdominal pain, fever and vomiting; significant leukocytosis.  CT ABDOMEN AND PELVIS WITH CONTRAST  Technique:  Multidetector CT imaging of the abdomen and pelvis was performed following the standard protocol during bolus administration of intravenous contrast.  Contrast: OMNIPAQUE IOHEXOL 300 MG/ML  SOLN  Comparison: CT of the abdomen and pelvis performed 05/11/2010  Findings: The  visualized lung bases are clear.  There is a significant amount of free air noted within the abdomen, predominantly anteriorly and tracking about the liver and stomach, though minimal scattered air is seen along the mesentery and omentum.  This arises from a perforated diverticulitis; a significant amount of soft tissue inflammation is noted about the proximal to mid sigmoid colon, with minimal associated fluid.  Associated soft tissue inflammation and trace free air tracks superiorly along the mesentery and adjacent small bowel loops.  The liver and spleen are unremarkable in appearance.  A few tiny stones are seen layering dependently within the gallbladder; the gallbladder is otherwise unremarkable in appearance.  The pancreas and adrenal glands are unremarkable.  Nonspecific perinephric stranding is noted bilaterally.  A small 1.5 cm exophytic cyst at the upper pole of the right kidney appears stable from 2012.  The kidneys are otherwise grossly unremarkable in appearance.  There is no evidence of hydronephrosis.  No renal or ureteral stones are seen.  No free fluid is identified.  The small bowel is unremarkable in appearance.  The stomach is within normal limits.  No acute vascular abnormalities are seen.  Minimal calcification is noted at the aortic bifurcation.  The appendix is dilated to 1.0 cm in diameter, with mild surrounding soft tissue inflammation.  However, the location of the free air and the predominant location of the soft tissue inflammation suggest this reflects a diverticulitis rather than an appendicitis.  Diffuse diverticulosis involves the descending and sigmoid colon.  The bladder is moderately distended and grossly unremarkable in appearance.  The prostate is borderline normal in size.  No inguinal lymphadenopathy is seen.  No acute osseous abnormalities are identified.  IMPRESSION:  1.  Significant free air noted within the abdomen.  This most likely arises from a perforated  diverticulitis, with soft tissue inflammation noted about the proximal to the sigmoid colon, and minimal associated free fluid.  Associated soft tissue inflammation and trace free air tracks superiorly along the mesentery and adjacent small bowel loops. 2.  Mild inflammation and dilatation of the appendix is thought to be reactive in nature, given the predominant location of the soft tissue inflammation and free air about the sigmoid colon. 3.  Diffuse diverticulosis involves the descending and sigmoid colon. 4.  Minimal calcification at the aortic bifurcation. 5.  Mild cholelithiasis noted; gallbladder otherwise unremarkable in appearance. 6.  Small stable right renal cyst noted.  Critical Value/emergent results were called by telephone at the time of interpretation on 06/18/2011  at 09:26 p.m.  to  [facility] PA, who verbally acknowledged these results.  Original Report Authenticated By: Tonia Ghent, M.D.   Dg Chest Port 1 View  06/18/2011  *RADIOLOGY REPORT*  Clinical Data: Abdominal pain; preoperative chest radiograph for perforated diverticulitis.  PORTABLE CHEST - 1 VIEW  Comparison: Chest radiograph performed 01/29/2009  Findings: The lungs are well-aerated.  Mild vascular congestion is noted, without definite evidence of pulmonary edema.  There is no evidence of focal opacification, pleural effusion or pneumothorax.  The cardiomediastinal silhouette is within normal limits.  No acute osseous abnormalities are seen.  IMPRESSION: Mild vascular congestion noted, without evidence of significant pulmonary edema.  Original Report Authenticated By: Tonia Ghent, M.D.    Medications:    . antiseptic oral rinse  15 mL Mouth Rinse q12n4p  . chlorhexidine  15 mL Mouth Rinse BID  . dextrose 5 % and 0.9 % NaCl with KCl 20 mEq/L      . ertapenem (INVANZ) IV  1 g Intravenous QHS  . heparin  5,000 Units Subcutaneous Q8H  . pantoprazole (PROTONIX) IV  40 mg Intravenous QHS    Assessment/Plan Perforated  sigmoid diverticulitis with periappendicitis s/p Laparoscopic exploration and irrigation of the abdominal cavity with appendectomy and placement of drains, 06/19/11 DR. Rosenbower. Day 3 for Invanz Heparin - DVT BMI 31.6   Plan:  We were hoping to take NG out, but he's put a liter out last 24 hours, no flatus.  Will ask nursing to change dressing, continue to mobilize.  IV tylenol for pain. Increase ambulation, and change dressings.   LOS: 2 days    Paul Sloan 06/20/2011

## 2011-06-20 NOTE — Progress Notes (Signed)
I have seen and examined the patient and agree with the assessment and plans. Will continue conservative management for now  Bellamia Ferch A. Isaiah Torok  MD, FACS 

## 2011-06-20 NOTE — Progress Notes (Signed)
Assessed patient IV found to have blood return, flushes well.

## 2011-06-21 LAB — CBC
Hemoglobin: 13.5 g/dL (ref 13.0–17.0)
MCH: 30.9 pg (ref 26.0–34.0)
MCV: 91.5 fL (ref 78.0–100.0)
Platelets: 304 10*3/uL (ref 150–400)
RBC: 4.37 MIL/uL (ref 4.22–5.81)
WBC: 11.4 10*3/uL — ABNORMAL HIGH (ref 4.0–10.5)

## 2011-06-21 LAB — COMPREHENSIVE METABOLIC PANEL
ALT: 50 U/L (ref 0–53)
AST: 31 U/L (ref 0–37)
CO2: 24 mEq/L (ref 19–32)
Calcium: 9.1 mg/dL (ref 8.4–10.5)
Chloride: 104 mEq/L (ref 96–112)
Creatinine, Ser: 0.99 mg/dL (ref 0.50–1.35)
GFR calc Af Amer: >90 mL/min (ref >90)
GFR calc non Af Amer: >90 mL/min (ref >90)
Glucose, Bld: 105 mg/dL — ABNORMAL HIGH (ref 70–99)
Total Bilirubin: 1 mg/dL (ref 0.3–1.2)

## 2011-06-21 MED ORDER — ACETAMINOPHEN 10 MG/ML IV SOLN
1000.0000 mg | Freq: Four times a day (QID) | INTRAVENOUS | Status: AC
  Administered 2011-06-21 – 2011-06-22 (×4): 1000 mg via INTRAVENOUS
  Filled 2011-06-21 (×7): qty 100

## 2011-06-21 NOTE — Progress Notes (Signed)
Earlier patient tolerated clamped NGT for 4 hours but for the most part remain NPO during the clamped period.   Dr. Rayburn Ma came and ordered to D/C the NGT.  RN was skeptical about D/C the the NGT without clamping the tube and having the patient eat some ice chips to ensure that he did not become nauseated.  RN discussed this with Sherrie George and the patient and the  PA stated to keep the NGT clamped until the AM and the he would be by in the morning to assess and determine about pulling the tube.

## 2011-06-21 NOTE — Progress Notes (Signed)
3 Days Post-Op  Subjective: He's feeling better, eating ice, none recorded, did some walking yesterday.  He's not real sure about flatus  Objective: Vital signs in last 24 hours: Temp:  [98 F (36.7 C)-98.6 F (37 C)] 98.6 F (37 C) (05/08 0601) Pulse Rate:  [70-81] 70  (05/08 0601) Resp:  [18] 18  (05/08 0601) BP: (122-136)/(77-85) 122/77 mmHg (05/08 0601) SpO2:  [96 %-99 %] 99 % (05/08 0601) Last BM Date: 06/18/11  1050 from NG yesterday, afebrile, VSS, WBC improving, CMP-normal  Intake/Output from previous day: 05/07 0701 - 05/08 0700 In: 3941.7 [I.V.:3041.7; NG/GT:900] Out: 3530 [Urine:2350; Emesis/NG output:1050; Drains:130] Intake/Output this shift:    General appearance: alert, cooperative and no distress Resp: clear to auscultation bilaterally GI: soft, few bowel sounds, no distension, wounds OK, he's not sure about flatus.  Drains are clear. 60 ml drain #1/ 70 ml drain #2  Lab Results:   Basename 06/21/11 0439 06/20/11 0423  WBC 11.4* 18.8*  HGB 13.5 13.0  HCT 40.0 38.1*  PLT 304 264    BMET  Basename 06/21/11 0439 06/19/11 0403  NA 136 135  K 3.7 3.8  CL 104 102  CO2 24 23  GLUCOSE 105* 151*  BUN 11 8  CREATININE 0.99 1.00  CALCIUM 9.1 9.1   PT/INR No results found for this basename: LABPROT:2,INR:2 in the last 72 hours   Lab 06/21/11 0439 06/18/11 1945  AST 31 19  ALT 50 31  ALKPHOS 70 75  BILITOT 1.0 1.7*  PROT 6.5 7.6  ALBUMIN 2.9* 3.9     Lipase  No results found for this basename: lipase     Studies/Results: No results found.  Medications:    . acetaminophen  1,000 mg Intravenous Q6H  . antiseptic oral rinse  15 mL Mouth Rinse q12n4p  . chlorhexidine  15 mL Mouth Rinse BID  . ertapenem (INVANZ) IV  1 g Intravenous QHS  . heparin  5,000 Units Subcutaneous Q8H  . pantoprazole (PROTONIX) IV  40 mg Intravenous QHS    Assessment/Plan Perforated sigmoid diverticulitis with periappendicitis s/p Laparoscopic exploration and  irrigation of the abdominal cavity with appendectomy and placement of drains, 06/19/11 DR. Rosenbower.  Day 3 for Invanz  Heparin - DVT  BMI 31.6   Plan:  Clamp NG and see how he does, continue ice chips, continue to mobilize, and antibiotics.     LOS: 3 days    Emaya Preston 06/21/2011

## 2011-06-21 NOTE — Progress Notes (Signed)
I have seen and examined the patient and agree with the assessment and plans.  Kirsten Spearing A. Tereza Gilham  MD, FACS  

## 2011-06-22 NOTE — Progress Notes (Signed)
UR complete 

## 2011-06-22 NOTE — Progress Notes (Signed)
4 Days Post-Op  Subjective: No complaints Passing flatus Minimal abdominal pain  NG not removed despite my order  Objective: Vital signs in last 24 hours: Temp:  [98.2 F (36.8 C)-99.1 F (37.3 C)] 98.7 F (37.1 C) (05/09 0700) Pulse Rate:  [72-76] 76  (05/09 0700) Resp:  [17-18] 18  (05/09 0700) BP: (120-125)/(71-82) 121/82 mmHg (05/09 0700) SpO2:  [92 %-98 %] 98 % (05/09 0700) Last BM Date: 06/18/11  Intake/Output from previous day: 05/08 0701 - 05/09 0700 In: 1328 [I.V.:1127; IV Piggyback:200] Out: 2260 [Urine:2200; Drains:60] Intake/Output this shift:    Abdomen soft, non-distended, minimally tender, drain serosang  Lab Results:   Basename 06/21/11 0439 06/20/11 0423  WBC 11.4* 18.8*  HGB 13.5 13.0  HCT 40.0 38.1*  PLT 304 264   BMET  Basename 06/21/11 0439  NA 136  K 3.7  CL 104  CO2 24  GLUCOSE 105*  BUN 11  CREATININE 0.99  CALCIUM 9.1   PT/INR No results found for this basename: LABPROT:2,INR:2 in the last 72 hours ABG No results found for this basename: PHART:2,PCO2:2,PO2:2,HCO3:2 in the last 72 hours  Studies/Results: No results found.  Anti-infectives: Anti-infectives     Start     Dose/Rate Route Frequency Ordered Stop   06/19/11 0315   ertapenem (INVANZ) 1 g in sodium chloride 0.9 % 50 mL IVPB        1 g 100 mL/hr over 30 Minutes Intravenous Daily at bedtime 06/19/11 0301     06/18/11 2200   imipenem-cilastatin (PRIMAXIN) 500 mg in sodium chloride 0.9 % 100 mL IVPB        500 mg 200 mL/hr over 30 Minutes Intravenous  Once 06/18/11 2146 06/18/11 2249          Assessment/Plan: s/p Procedure(s) (LRB): LAPAROSCOPY DIAGNOSTIC (N/A)  D/c ng Start clear liquids  LOS: 4 days    Niv Darley A 06/22/2011

## 2011-06-23 ENCOUNTER — Encounter (INDEPENDENT_AMBULATORY_CARE_PROVIDER_SITE_OTHER): Payer: BC Managed Care – PPO | Admitting: General Surgery

## 2011-06-23 ENCOUNTER — Encounter (HOSPITAL_COMMUNITY): Payer: Self-pay | Admitting: General Surgery

## 2011-06-23 ENCOUNTER — Telehealth (INDEPENDENT_AMBULATORY_CARE_PROVIDER_SITE_OTHER): Payer: Self-pay

## 2011-06-23 MED ORDER — OXYCODONE-ACETAMINOPHEN 5-325 MG PO TABS: 1.0000 | ORAL_TABLET | ORAL | Status: AC | PRN

## 2011-06-23 MED ORDER — IBUPROFEN 200 MG PO TABS: ORAL_TABLET | ORAL | Status: DC

## 2011-06-23 MED ORDER — CIPROFLOXACIN HCL 500 MG PO TABS
500.0000 mg | ORAL_TABLET | Freq: Two times a day (BID) | ORAL | Status: DC
  Administered 2011-06-23: 500 mg via ORAL
  Filled 2011-06-23 (×3): qty 1

## 2011-06-23 MED ORDER — CIPROFLOXACIN HCL 500 MG PO TABS: 500.0000 mg | ORAL_TABLET | Freq: Two times a day (BID) | ORAL | Status: AC

## 2011-06-23 MED ORDER — METRONIDAZOLE 500 MG PO TABS: 500.0000 mg | ORAL_TABLET | Freq: Three times a day (TID) | ORAL | Status: AC

## 2011-06-23 MED ORDER — ACETAMINOPHEN 325 MG PO TABS: 650.0000 mg | ORAL_TABLET | Freq: Four times a day (QID) | ORAL | Status: DC | PRN

## 2011-06-23 MED ORDER — METRONIDAZOLE IN NACL 5-0.79 MG/ML-% IV SOLN
500.0000 mg | Freq: Three times a day (TID) | INTRAVENOUS | Status: DC
  Filled 2011-06-23 (×2): qty 100

## 2011-06-23 NOTE — Discharge Instructions (Signed)
Diverticulosis and Diverticulitis, Including Presurgical and Postsurgical Guidelines Many people have small pouches in their colons that bulge outward through weak spots. This is like an inner tube that pokes through weak places in a tire. Each pouch is called a diverticulum. Pouches (plural or more than one) are called diverticuli. The condition of having diverticuli is called diverticulosis. About 10 percent of Americans over the age of 40 have diverticulosis. The condition becomes more common as we age. About half of all people over the age of 60 have diverticulosis. When the pouches become infected or inflamed, the condition is called diverticulitis. This happens in 10 to 25 percent of people with diverticuli. Diverticulosis and diverticulitis are also called diverticular disease. CAUSES A low-fiber diet may be the main cause of diverticular disease. Many processed foods contain refined, low-fiber flour. Unlike whole-wheat flour, refined flour has no wheat bran. Diverticulitis is more common in developed countries and less common in countries where people eat high-fiber vegetable diets. Fiber is the part of fruits, vegetables, and grains that the body cannot digest. Some fiber dissolves easily in water. It takes on a soft, jelly-like texture in the intestines. Some fiber passes almost unchanged through the intestines. Both kinds of fiber help make stools soft and easy to pass. Fiber also prevents constipation (hardening of the stools). Constipation makes the muscles strain to move stool that is too hard. This causes increased pressure in the colon. Increased pressure in the colon may cause weak spots to bulge out and become diverticuli. Diverticulitis occurs when diverticuli become infected or inflamed. Doctors are not certain what causes the infection. It may begin when stool or bacteria are caught in the diverticuli. An attack of diverticulitis can develop suddenly and without warning. SYMPTOMS    Diverticulosis  Most people with diverticulosis do not have any discomfort or problems. If symptoms develop, they may include:   Mild cramps.   Bloating.   Constipation.   Other diseases such as irritable bowel syndrome (IBS) and stomach ulcers cause similar problems, so these symptoms do not always mean a person has diverticulosis. You should visit your caregiver if you have these troubling and unexplained symptoms.  Diverticulitis  The most common symptom of diverticulitis is abdominal pain which occurs primarily around the left side of the lower abdomen. The severity of symptoms depends on the extent of the infection and complications. If infection is the cause these symptoms may occur:   Fever.   Nausea.   Vomiting.   Chills.   Cramping.   Constipation.  RELATED COMPLICATIONS Complications from diverticulitis usually need further treatment. Complications include: Bleeding  Bleeding from diverticuli is a rare complication. When diverticuli bleed, blood may appear in the toilet or in your stool. Bleeding can be severe, but it may stop by itself and not require treatment. It is thought that bleeding diverticuli are caused by a small blood vessel in a diverticulum that weakens and finally bursts. If you have bleeding from the rectum, you should see your doctor. If the bleeding does not stop, surgery may be necessary.  Abscess, Perforation, and Peritonitis  The infection causing diverticulitis often clears up after a few days of treatment with antibiotics. If the condition gets worse, complications such as an abscess may form. An abscess is a collection of pus that may cause swelling and destroy tissue. Sometimes the infected diverticuli may develop small holes, called perforations. These small holes allow pus to leak out of the colon into the belly (abdomen). If the abscess   is small and remains in the colon, it may clear up after treatment with antibiotics. If the abscess does not  clear up with antibiotics, the doctor may need to drain it. To drain the abscess, the doctor uses a needle and a small tube called a catheter. The doctor inserts the needle through the skin and drains the fluid through the catheter. This procedure is called percutaneous catheter drainage. Sometimes surgery is needed to clean the abscess and, if necessary, remove part of the colon. A large abscess can become a serious problem if the infection leaks out into the abdomen. Infection that spreads into the abdominal cavity is called peritonitis. Peritonitis requires immediate surgery to clean the abdominal cavity and remove the damaged part of the colon. Without surgery, peritonitis can be fatal.  Fistula  A fistula is an opening between two organs or between an organ and the skin. When damaged tissues come into contact with each other during infection, they sometimes stick together. If they heal that way, a fistula may form. In diverticulitis, if the infection spreads outside the colon, the entire abdomen can become infected and inflamed. The colon's tissue may stick to nearby tissues. The organs usually involved are the:   Bladder.   Small intestine.   Skin.   The most common type of fistula occurs between the bladder and the colon. It affects men more than women. This type of fistula can result in a severe, long-lasting infection of the urinary tract. The problem can be corrected with surgery to remove the fistula and the affected part of the colon.  Intestinal Obstruction  The scarring caused by infection may cause partial or total blockage of the large intestine. When this happens, the colon is unable to move bowel contents normally. When the obstruction totally blocks the intestine, emergency surgery is necessary. Partial blockage is not an emergency, so the surgery to correct it can be planned.  DIAGNOSIS To diagnose diverticular disease, the doctor:  Asks about medical history.   Will do a  physical exam.   May perform one or more diagnostic tests.  Because most people do not have symptoms, diverticulosis is often found through tests ordered for another ailment. When taking a medical history, the doctor may ask about:  Bowel habits.   Symptoms.   Pain.   Diet.   Medications.  The physical exam usually involves a digital rectal exam. To perform this test, the doctor inserts a gloved, lubricated finger into the rectum to detect tenderness, blockage, or blood. The doctor may check stool for signs of bleeding and do a blood test to look for signs of infection. The doctor may also order x rays or other tests. TREATMENT  A high-fiber diet and mild pain medications will help relieve symptoms in most cases. Sometimes an attack of diverticulitis is serious enough to require a hospital stay and possibly surgery. Treatment for diverticulitis focuses on clearing up the infection and inflammation, resting the colon, and preventing or minimizing complications. An attack of diverticulitis without complications may respond to antibiotics within a few days if treated early. To help the colon rest, the doctor may recommend bed rest and a liquid diet, along with a pain reliever. An acute (sudden and sharp) attack with severe pain or severe infection may require a hospital stay. Most acute cases of diverticulitis are treated with antibiotics and a liquid diet. The antibiotics are given by injection into a vein (through an IV). In some cases, however, surgery may be necessary.   FIBER CONTENT OF FOODS Increasing the amount of fiber in the diet may reduce symptoms of diverticulosis and prevent complications such as diverticulitis. Fiber keeps stool soft and lowers pressure inside the colon so that bowel contents can move through easily. The American Dietetic Association recommends 20 to 35 grams of fiber each day. The table below shows the amount of fiber in some foods that you can easily add to your  diet. Adding fiber slowly may decrease the bloating and fullness sometimes felt with an immediate high fiber diet. Fruits  Apple, raw, with skin 1 medium = 3.3 grams   Peach, raw 1 medium = 1.5 grams   Pear, raw 1 medium = 5.1 grams   Tangerine, raw 1 medium = 1.9 grams  Vegetables  Asparagus, fresh, cooked 4 spears = 1.2 grams   Broccoli, fresh, cooked  cup = 2.6 grams   Brussels sprouts, fresh, cooked  cup = 2 grams   Cabbage, fresh, cooked  cup = 1.5 grams   Carrot, fresh, cooked  cup = 2.3 grams   Cauliflower, fresh, cooked  cup = 1.7 grams   Romaine lettuce 1 cup = 1.2 grams   Spinach, fresh, cooked  cup = 2.2 grams   Summer squash, cooked 1 cup = 2.5 grams   Tomato, raw 1 = 1 gram   Winter squash, cooked 1 cup = 5.7 grams  Starchy Vegetables  Baked beans, canned, plain  cup = 6.3 grams   Kidney beans, fresh, cooked  cup = 5.7 grams   Lima beans, fresh, cooked  cup = 6.6 grams   Potato, fresh, cooked 1 = 2.3 grams  Grains  Bread, whole-wheat 1 slice = 1.9 grams   Brown rice, cooked 1 cup = 3.5 grams   Cereal, bran flake  cup = 5.3 grams   Oatmeal, plain, cooked  cup = 3 grams   White rice, cooked 1 cup = 0.6 grams  Source: [place] Department of Agriculture (USDA).  The doctor may also recommend taking a fiber product such as Citrucel or Metamucil once a day. These products are mixed with water and provide about 2 to 3.5 grams of fiber per tablespoon, mixed with 8 ounces of water. If cramps, bloating, and constipation are problems, the doctor may prescribe a short course of pain medication. However, many medications affect emptying of the colon, an undesirable side effect for people with diverticulosis. WHEN IS SURGERY NECESSARY? If attacks are severe or frequent, the doctor may advise surgery. The surgeon removes the affected part of the colon and joins the remaining sections. This type of surgery, called colon resection, aims to keep  attacks from coming back and to prevent complications. The doctor may also recommend surgery for complications of a fistula or intestinal obstruction. If antibiotics do not correct an attack, emergency surgery may be required. Other reasons for emergency surgery include:  A large abscess.   Perforation.   Peritonitis.   Continued bleeding.  Emergency surgery usually involves two operations. The first surgery will clear the infected abdominal cavity and remove part of the colon. Because of infection and sometimes obstruction, it is not safe to rejoin the colon during the first operation. Instead, the surgeon creates a temporary hole, or stoma, in the abdomen. The end of the colon is connected to the hole, a procedure called a colostomy, to allow normal eating and bowel movements. The stool goes into a bag attached to the opening in the abdomen. In the second operation, the   surgeon rejoins the ends of the colon. PRESURGICAL AND POSTSURGICAL GUIDELINES FOR PATIENTS These are general sets of instructions and of course some guidelines do not apply when there is an emergency present and an operation must be performed immediately. During such times, surgery is often performed under less than desirable circumstances and guidelines and care instructions may be different. LET YOUR CAREGIVER KNOW ABOUT:  Allergies.   Medications taken including herbs, eye drops, over the counter medications, and creams.   Use of steroids (by mouth or creams).   Previous problems with anesthetics.   Possibility of pregnancy, if this applies.   History of blood clots (thrombophlebitis).   History of bleeding or blood problems.   Previous surgery.   Other health problems.  There is a waiting area for your family or friends while you are having your surgery. BEFORE THE PROCEDURE PREPARING FOR ELECTIVE (PLANNED AND NON-EMERGENCY) SURGERY  Stop herbal supplements two weeks Prior To Surgery (PTS).   Stop smoking  at least two weeks PTS. This lowers risk during surgery. Ask your caregiver for help with this if needed. The benefits are well worth it and this is a good time for a healthy lifestyle change.   Do not take aspirin within one week PTS unless instructed otherwise. Only take over-the-counter or prescription medicines for pain, discomfort, or fever as directed by your caregiver.   Do not take anti-inflammatory medications (such as Advil or Motrin for example) for forty-eight hours PTS.   Notify your caregiver if you have been on steroids within the past 7 days whether taken by mouth or applied to the skin. THIS IS CRITICAL.  Your caregiver will discuss possible risks and complications with you before surgery. In addition to the usual risks of anesthesia, other common risks and complications include: blood loss and replacement (usually not applicable to minor surgical procedures); temporary increase in pain due to surgery; uncorrected pain or problems the surgery was meant to correct; infection; or new damage. For same day surgical patients arrange for someone to take you home from the hospital and to stay with you for twenty-four hours after the procedure. Medications given for your procedure may prevent you from driving a car or caring for yourself. If these arrangements have not been made, surgery may have to be canceled. Surgery will also be canceled if you fail to follow instructions before surgery. THE DAY BEFORE SURGERY:  Eat your usual meals and a light supper. Continue fluid intake. No alcohol intake.   Do not eat or drink after midnight. Your surgery will be canceled if you eat or drink after midnight. Take your usual medications the morning of surgery with a sip of water unless instructed otherwise. Check with your caregiver if unsure.   Do not smoke. The longer the period of time smoking is stopped before surgery, the less likely you will be to have lung problems after surgery.   Call your  caregivers office the morning prior to surgery if you develop an illness or problem which may prevent you from safely having your procedure.  THE MORNING OF SURGERY:  Take medications as directed with a sip of water.   Bathe or shower. Your caregiver may provide you with a special soap to use.   Remove makeup, nail polish and jewelry.   Bring glasses or contact case to store your glasses or contacts if you use them. Dentures will be removed before surgery.   Wear loose fitting clothing and low or no heeled shoes.  If you are a nursing mother ask the anesthetist how long you should wait before nursing again.   Do not bring un-chaperoned children. Most medical centers are unable to provide for care and supervision of children.  BEFORE SURGERY: You should be present 60 minutes prior to your procedure or as directed.  AFTER THE PROCEDURE  Same day surgery does not mean same day recovery. Healing will take some time. You will have tenderness at the operative site and there may be some swelling and bruising at the wound site or sites. You may have some nausea. The responsible adult who will take you home should arrange to stay with you the first 24 hours.   After surgery, you will be taken to the recovery area where a nurse will monitor your progress.   When you are awake, stable, and taking fluids well, if there are no other problems, you will be allowed to go home.   Once home, an ice pack applied to your operative site for fifteen or twenty minutes four times per day may help with discomfort, and keep swelling down.   Do not drive for twenty-four hours or until feeling normal again or when instructed by your caregiver. Do not drive while taking prescription pain medications.   Do not consume alcoholic beverages. Do not smoke and if you do this is a good time to quit permanently.   Do not make important decisions or sign legal documents. You may resume normal diet and activities as  directed.   Change dressings as directed.   Only take over-the-counter or prescription medicines for pain, discomfort, or fever as directed by your caregiver.   Keep all appointments as scheduled and follow all instructions.   Ask questions if you do not understand something.   Make sure that you and your family fully understand everything about your operation.  SEEK IMMEDIATE MEDICAL CARE IF:   You have increased bleeding (more than a small spot) from wounds or biopsy sites.   You see swelling or have increasing pain in wounds or biopsy sites.   You have pus coming from wound.   There is an unexplained temperature over 102 F (38.9 C).   There is a bad smell coming from the wound or dressing.   You develop lightheadedness or feel faint.  FOR MORE INFORMATION  International Foundation for Functional Gastrointestinal Disorders: www.iffgd.org Document Released: 10/30/2003 Document Revised: 10/12/2010 Document Reviewed: 03/10/2008 ExitCare Patient Information 2012 ExitCare, [facility] ______[facility] LAPAROSCOPIC SURGERY: POST OP INSTRUCTIONS Always review your discharge instruction sheet given to you by the facility where your surgery was performed. IF YOU HAVE DISABILITY OR FAMILY LEAVE FORMS, YOU MUST BRING THEM TO THE OFFICE FOR PROCESSING.   DO NOT GIVE THEM TO YOUR DOCTOR.  1. A prescription for pain medication may be given to you upon discharge.  Take your pain medication as prescribed, if needed.  If narcotic pain medicine is not needed, then you may take acetaminophen (Tylenol) or ibuprofen (Advil) as needed. 2. Take your usually prescribed medications unless otherwise directed. 3. If you need a refill on your pain medication, please contact your pharmacy.  They will contact our office to request authorization. Prescriptions will not be filled after 5pm or on week-ends. 4. You should follow a light diet the first few days after arrival home, such as soup  and crackers, etc.  Be sure to include lots of fluids daily. 5. Most patients will experience some swelling and bruising in the  area of the incisions.  Ice packs will help.  Swelling and bruising can take several days to resolve.  6. It is common to experience some constipation if taking pain medication after surgery.  Increasing fluid intake and taking a stool softener (such as Colace) will usually help or prevent this problem from occurring.  A mild laxative (Milk of Magnesia or Miralax) should be taken according to package instructions if there are no bowel movements after 48 hours. 7. Unless discharge instructions indicate otherwise, you may remove your bandages 24-48 hours after surgery, and you may shower at that time.  You may have steri-strips (small skin tapes) in place directly over the incision.  These strips should be left on the skin for 7-10 days.  If your surgeon used skin glue on the incision, you may shower in 24 hours.  The glue will flake off over the next 2-3 weeks.  Any sutures or staples will be removed at the office during your follow-up visit. 8. ACTIVITIES:  You may resume regular (light) daily activities beginning the next day--such as daily self-care, walking, climbing stairs--gradually increasing activities as tolerated.  You may have sexual intercourse when it is comfortable.  Refrain from any heavy lifting or straining until approved by your doctor. a. You may drive when you are no longer taking prescription pain medication, you can comfortably wear a seatbelt, and you can safely maneuver your car and apply brakes. b. RETURN TO WORK:  __________________________________________________________ 9. You should see your doctor in the office for a follow-up appointment approximately 2-3 weeks after your surgery.  Make sure that you call for this appointment within a day or two after you arrive home to insure a convenient appointment time. 10. OTHER INSTRUCTIONS:  __________________________________________________________________________________________________________________________ __________________________________________________________________________________________________________________________ WHEN TO CALL YOUR DOCTOR: 1. Fever over 101.0 2. Inability to urinate 3. Continued bleeding from incision. 4. Increased pain, redness, or drainage from the incision. 5. Increasing abdominal pain  The clinic staff is available to answer your questions during regular business hours.  Please don't hesitate to call and ask to speak to one of the nurses for clinical concerns.  If you have a medical emergency, go to the nearest emergency room or call 911.  A surgeon from [facility] is always on call at the hospital. [address], [address], [place], [place]  [address] ? [address] [address], [place], [place]   [phone] ? [phone] ? FAX [phone] Web site: [email or web address]  Low Fiber and Residue Restricted Diet A low fiber diet restricts foods that contain carbohydrates that are not digested in the small intestine. A diet containing about 10 g of fiber is considered low fiber. The diet needs to be individualized to suit patient tolerances and preferences and to avoid unnecessary restrictions. Generally, the foods emphasized in a low fiber diet have no skins or seeds. They may have been processed to remove bran, germ, or husks. Cooking may not necessarily eliminate the fiber. Cooking may, in fact, enable a greater quantity of fiber to be consumed in a lesser volume. Legumes and nuts are also restricted. The term low residue has also been used to describe low fiber diets, although the two are not the same. Residue refers to any substance that adds to bowel (colonic) contents, such as sloughed cells and intestinal bacteria, in addition to fiber. Residue-containing foods, prunes and prune juice, milk, and connective tissue  from meats may also need to be eliminated. It is important to eliminate these foods  during sudden (acute) attacks of inflammatory bowel disease, when there is a partial obstruction due to another reason, or when minimal fecal output is desired. When these problems are gone, a more normal diet may be used. PURPOSE  Prevent blockage of a partially obstructed or narrowed gastrointestinal tract.   Reduce stool weight and volume.   Slow the movement of waste.  WHEN IS THIS DIET USED?  Acute phase of Crohn's disease, ulcerative colitis, regional enteritis, or diverticulitis.   Narrowing (stenosis) of intestinal or esophageal tubes (lumina).   Transitional diet following surgery, injury (trauma), or illness.  ADEQUACY This diet is nutritionally adequate based on individual food choices according to the Recommended Dietary Allowances of the [employer]. CHOOSING FOODS Check labels, especially on foods from the starch list. Often, dietary fiber content is listed with the Nutrition Facts panel.  Breads and Starches  Allowed: White, [place], and pita breads, plain rolls, buns, or sweet rolls, doughnuts, waffles, pancakes, bagels. Plain muffins, sweet breads, biscuits, matzoth. Flour. Soda, saltine, or graham crackers. Pretzels, rusks, melba toast, zwieback. Cooked cereals: cornmeal, farina, cream cereals. Dry cereals: refined corn, wheat, rice, and oat cereals (check label). Potatoes prepared any way without skins, refined macaroni, spaghetti, noodles, refined rice.   Avoid: Bread, rolls, or crackers made with whole-wheat, multigrains, rye, bran seeds, nuts, or coconut. Corn tortillas, table-shells. Corn chips, tortilla chips. Cereals containing whole-grains, multigrains, bran, coconut, nuts, or raisins. Cooked or dry oatmeal. Coarse wheat cereals, granola. Cereals advertised as "high fiber." Potato skins. Whole-grain pasta, wild or brown rice. Popcorn.  Vegetables  Allowed:  Strained  tomato and vegetable juices. Fresh: tender lettuce, cucumber, cabbage, spinach, bean sprouts. Cooked, canned: asparagus, bean sprouts, cut green or wax beans, cauliflower, pumpkin, beets, mushrooms, olives, spinach, yellow squash, tomato, tomato sauce (no seeds), zucchini (peeled), turnips. Canned sweet potatoes. Small amounts of celery, onion, radish, and green pepper may be used. Keep servings limited to  cup.   Avoid: Fresh, cooked, or canned: artichokes, baked beans, beet greens, broccoli, Brussels sprouts, French-style green beans, corn, kale, legumes, peas, sweet potatoes. Cooked: green or red cabbage, spinach. Avoid large servings of any vegetables.  Fruit  Allowed:  All fruit juices except prune juice. Cooked or canned: apricots applesauce, cantaloupe, cherries, grapefruit, grapes, kiwi, mandarin oranges, peaches, pears, fruit cocktail, pineapple, plums, watermelon. Fresh: banana, grapes, cantaloupe, avocado, cherries, pineapple, grapefruit, kiwi, nectarines, peaches, oranges, blueberries, plums. Keep servings limited to  cup or 1 piece.   Avoid: Fresh: apple with or without skin, apricots, mango, pears, raspberries, strawberries. Prune juice, stewed or dried prunes. Dried fruits, raisins, dates. Avoid large servings of all fresh fruits.  Meat and Meat Substitutes  Allowed:  Ground or well-cooked tender beef, ham, veal, lamb, pork, or poultry. Eggs, plain cheese. Fish, oysters, shrimp, lobster, other seafood. Liver, organ meats.   Avoid: Tough, fibrous meats with gristle. Peanut butter, smooth or chunky. Cheese with seeds, nuts, or other foods not allowed. Nuts, seeds, legumes, dried peas, beans, lentils.  Milk  Allowed:  All milk products except those not allowed. Milk and milk product consumption should be minimal when low residue is desired.   Avoid: Yogurt that contains nuts or seeds.  Soups and Combination Foods  Allowed:  Bouillon, broth, or cream soups made from allowed foods.  Any strained soup. Casseroles or mixed dishes made with allowed foods.   Avoid: Soups made from vegetables that are not allowed or that contain other foods not allowed.  Desserts and Sweets  Allowed:  Plain cakes and cookies, pie made with allowed fruit, pudding, custard, cream pie. Gelatin, fruit, ice, sherbet, frozen ice pops. Ice cream, ice milk without nuts. Plain hard candy, honey, jelly, molasses, syrup, sugar, chocolate syrup, gumdrops, marshmallows.   Avoid: Desserts, cookies, or candies that contain nuts, peanut butter, or dried fruits. Jams, preserves with seeds, marmalade.  Fats and Oils  Allowed:  Margarine, butter, cream, mayonnaise, salad oils, plain salad dressings made from allowed foods. Plain gravy, crisp bacon without rind.   Avoid: Seeds, nuts, olives. Avocados.  Beverages  Allowed:  All, except those listed to avoid.   Avoid: Fruit juices with high pulp, prune juice.  Condiments  Allowed:  Ketchup, mustard, horseradish, vinegar, cream sauce, cheese sauce, cocoa powder. Spices in moderation: allspice, basil, bay leaves, celery powder or leaves, cinnamon, cumin powder, curry powder, ginger, mace, marjoram, onion or garlic powder, oregano, paprika, parsley flakes, ground pepper, rosemary, sage, savory, tarragon, thyme, turmeric.   Avoid: Coconut, pickles.  SAMPLE MEAL PLAN The following menu is provided as a sample. Your daily menu plans will vary. Be sure to include a minimum of the following each day in order to provide essential nutrients for the adult:  Starch/Bread/Cereal Group, 6 servings.   Fruit/Vegetable Group, 5 servings.   Meat/Meat Substitute Group, 2 servings.   Milk/Milk Substitute Group, 2 servings.  A serving is equal to  cup for fruits, vegetables, and cooked cereals or 1 piece for foods such as a piece of bread, 1 orange, or 1 apple. For dry cereals and crackers, use serving sizes listed on the label. Combination foods may count as full or partial  servings from various food groups. Fats, desserts, and sweets may be added to the meal plan after the requirements for essential nutrients are met. SAMPLE MENU Breakfast   cup orange juice.   1 boiled egg.   1 slice white toast.   Margarine.    cup cornflakes.   1 cup milk.   Beverage.  Lunch   cup chicken noodle soup.   2 to 3 oz sliced roast beef.   2 slices seedless rye bread.   Mayonnaise.    cup tomato juice.   1 small banana.   Beverage.  Dinner  3 oz baked chicken.    cup scalloped potatoes.    cup cooked beets.   White dinner roll.   Margarine.    cup canned peaches.   Beverage.  Document Released: 07/22/2001 Document Revised: 01/19/2011 Document Reviewed: 01/02/2011 [facility] Patient Information 2012 [place], [place].Marland Kitchen

## 2011-06-23 NOTE — Progress Notes (Signed)
I have seen and examined the patient and agree with the assessment and plans.  His abdomen is totally non tender.  Discussed with Dr. Abbey Chatters.  Will pull drains and discharge on antibiotics  Asbury Hair A. Magnus Ivan  MD, FACS

## 2011-06-23 NOTE — Telephone Encounter (Signed)
LMOV pt's appt rescheduled from 5/16 to 5/21 at 9:10.

## 2011-06-23 NOTE — Progress Notes (Signed)
5 Days Post-Op  Subjective: Feels better, had a BM last night, not recorded but he was proud of it and showed it to nurse last PM.  Objective: Vital signs in last 24 hours: Temp:  [98.2 F (36.8 C)-99.1 F (37.3 C)] 99.1 F (37.3 C) (05/10 0551) Pulse Rate:  [77-104] 82  (05/10 0551) Resp:  [18-22] 18  (05/10 0551) BP: (114-133)/(72-89) 114/72 mmHg (05/10 0551) SpO2:  [98 %-100 %] 100 % (05/10 0551) Last BM Date: 06/17/11  Drain #1 15 ml, DRain #2 30 ml, Tm 99.1, VSS, No labs  Intake/Output from previous day: 05/09 0701 - 05/10 0700 In: 3306.7 [P.O.:720; I.V.:2586.7] Out: 2395 [Urine:2350; Drains:45] Intake/Output this shift:    General appearance: alert, cooperative and no distress Resp: clear to auscultation bilaterally GI: soft, not real tender, incisions all look good. Drains show clear serous drainage.    Lab Results:   [facility] 06/21/11 0439  WBC 11.4*  HGB 13.5  HCT 40.0  PLT 304    BMET  Basename 06/21/11 0439  NA 136  K 3.7  CL 104  CO2 24  GLUCOSE 105*  BUN 11  CREATININE 0.99  CALCIUM 9.1   PT/INR No results found for this basename: LABPROT:2,INR:2 in the last 72 hours   Lab 06/21/11 0439 06/18/11 1945  AST 31 19  ALT 50 31  ALKPHOS 70 75  BILITOT 1.0 1.7*  PROT 6.5 7.6  ALBUMIN 2.9* 3.9     Lipase  No results found for this basename: lipase     Studies/Results: No results found.  Medications:    . antiseptic oral rinse  15 mL Mouth Rinse q12n4p  . chlorhexidine  15 mL Mouth Rinse BID  . ertapenem (INVANZ) IV  1 g Intravenous QHS  . heparin  5,000 Units Subcutaneous Q8H  . pantoprazole (PROTONIX) IV  40 mg Intravenous QHS    Assessment/Plan Perforated sigmoid diverticulitis with periappendicitis s/p Laparoscopic exploration and irrigation of the abdominal cavity with appendectomy and placement of drains, 06/19/11 DR. Rosenbower.  Day 6 for Paul Sloan gets it at 2100 hrs daily Heparin - DVT  BMI 31.6   Plan: Advance diet, will  check on switching to oral antibiotics and removing drains.  He may be able to go home over weekend. Discussed with DR. Magnus Ivan  Will d/c drain and send him home today. Full liquids 24 hours then low fiber diet.  Follow up with Dr. Abbey Chatters next week.   LOS: 5 days    Paul Sloan 06/23/2011

## 2011-06-23 NOTE — Discharge Summary (Signed)
Physician Discharge Summary  Patient ID: Paul Sloan MRN: [id number] DOB/AGE: 53-Oct-1962 53 y.o.  Admit date: 06/18/2011 Discharge date: 06/23/2011  Admission Diagnoses:    Perforated sigmoid diverticulitis with periappendicitis      Discharge Diagnoses: Same Principal Problem:  *Diverticulitis of colon with perforation s/p laparoscopic irrigation/appendectomy/drain placement 06/19/11   PROCEDURES: Laparoscopic exploration and irrigation of the abdominal cavity with appendectomy and placement of drains 06/19/11 Dr. Huntley Dec Course: He was in his normal state of health until yesterday after lunch when he developed the sudden onset of lower abdominal pain. The pain was persistent and was associated with a fever as high as 102. He presented to the walk-in clinic and there was concern for acute appendicitis as he had a fever of 101 and an elevation of his white blood cell count. He was sent to the emergency department where he underwent a CT scan that demonstrated significant amount of free intraperitoneal air as well as significant inflammatory changes of the sigmoid colon consistent with diverticulitis with perforation. The appendix was dilated as well with inflammatory changes around it felt to be secondary inflammatory changes. There is a small amount of free fluid around the sigmoid colon. I was asked to see him at this time. He's been started on IV Primaxin. He does have a previous history of diverticulitis.  Pt underwent procedure as noted above.  He could not find a leak, he did remove the appendix, and placed drains.  Pt has done well and diet was advanced to Clears, he had minimal drainage and by 06/22/11 Dr . Magnus Ivan felt he could have drains removed and go home.  Follow up scheduled for next week with DR. Rosenbower.  We sent him home with 10 more days of Cipro and Flagy..  He has instructions to call if he has any problems.  Condition on D/C: Improved  Disposition:  Final discharge disposition not confirmed  Discharge Orders    Future Appointments: Provider: Department: Dept Phone: Center:   06/29/2011 8:30 AM Adolph Pollack, MD [facility] [phone] None     Medication List  As of 06/23/2011 11:05 AM   TAKE these medications         acetaminophen 325 MG tablet   Commonly known as: TYLENOL   Take 2 tablets (650 mg total) by mouth every 6 (six) hours as needed for pain.      ciprofloxacin 500 MG tablet   Commonly known as: CIPRO   Take 1 tablet (500 mg total) by mouth 2 (two) times daily.      CLARITIN 10 MG Caps   Generic drug: Loratadine   Take 10 mg by mouth daily.      ibuprofen 200 MG tablet   Commonly known as: ADVIL,MOTRIN   2-3 tablets every 6 hours for pain as needed.  You may use plain tylenol or Percocet for pain also.      metroNIDAZOLE 500 MG tablet   Commonly known as: FLAGYL   Take 1 tablet (500 mg total) by mouth 3 (three) times daily.      oxyCODONE-acetaminophen 5-325 MG per tablet   Commonly known as: PERCOCET   Take 1 tablet by mouth every 4 (four) hours as needed for pain.           Follow-up Information    Follow up with Adolph Pollack, MD on 06/29/2011. (Be at office 8:15 for check in, appointment at 0830 AM)    Contact information:   [facility]  [place] [address] [place] [address] [phone]       Follow up with Full liquid diet for next 24 hours, then Low Fiber diet as tolerated..         SignedSherrie George 06/23/2011, 11:05 AM

## 2011-06-28 ENCOUNTER — Telehealth (INDEPENDENT_AMBULATORY_CARE_PROVIDER_SITE_OTHER): Payer: Self-pay | Admitting: General Surgery

## 2011-06-28 NOTE — Telephone Encounter (Signed)
Discussed restrictions of no lifting over 20lbs until released by Dr. Abbey Chatters.  He may return to work if he feels up to it.

## 2011-06-29 ENCOUNTER — Encounter (INDEPENDENT_AMBULATORY_CARE_PROVIDER_SITE_OTHER): Payer: BC Managed Care – PPO | Admitting: General Surgery

## 2011-07-04 ENCOUNTER — Encounter (INDEPENDENT_AMBULATORY_CARE_PROVIDER_SITE_OTHER): Payer: BC Managed Care – PPO | Admitting: General Surgery

## 2011-07-17 ENCOUNTER — Ambulatory Visit (INDEPENDENT_AMBULATORY_CARE_PROVIDER_SITE_OTHER): Payer: BC Managed Care – PPO | Admitting: General Surgery

## 2011-07-17 ENCOUNTER — Other Ambulatory Visit (INDEPENDENT_AMBULATORY_CARE_PROVIDER_SITE_OTHER): Payer: Self-pay | Admitting: General Surgery

## 2011-07-17 ENCOUNTER — Encounter (INDEPENDENT_AMBULATORY_CARE_PROVIDER_SITE_OTHER): Payer: Self-pay | Admitting: General Surgery

## 2011-07-17 VITALS — BP 126/90 | HR 104 | Temp 98.6°F | Resp 14 | Ht 74.0 in | Wt 241.4 lb

## 2011-07-17 DIAGNOSIS — Z09 Encounter for follow-up examination after completed treatment for conditions other than malignant neoplasm: Secondary | ICD-10-CM

## 2011-07-17 DIAGNOSIS — K5792 Diverticulitis of intestine, part unspecified, without perforation or abscess without bleeding: Secondary | ICD-10-CM

## 2011-07-17 MED ORDER — METRONIDAZOLE 500 MG PO TABS: 500.0000 mg | ORAL_TABLET | Freq: Three times a day (TID) | ORAL | Status: DC

## 2011-07-17 MED ORDER — CIPROFLOXACIN HCL 750 MG PO TABS: 750.0000 mg | ORAL_TABLET | Freq: Two times a day (BID) | ORAL | Status: DC

## 2011-07-17 NOTE — Patient Instructions (Signed)
Liquid diet.  Take antibiotics as directed.  Call us if you are not feeling better within 48 hours.

## 2011-07-17 NOTE — Progress Notes (Signed)
Patient ID: Paul Sloan, male   DOB: 10/23/1960, 51 y.o.   MRN: [id number]  Chief Complaint  Patient presents with  . Routine Post Op    Reck/hosp f/u  5/5-5/10    HPI Paul Sloan is a 51 y.o. male.  HPI  He is here for his first postoperative visit after laparoscopic appendectomy and drainage of a contained perforation of sigmoid diverticulitis Jun 18, 2011. He did well and was discharged on Jun 23, 2011. He was taking Cipro and Flagyl. He has been off this for 2 weeks. For the past 4-5 days, however, he's been having fevers at night as well as mucous-type bowel movements and some lower abdominal discomfort.  Past Medical History  Diagnosis Date  . Diverticulitis of colon with perforation     Past Surgical History  Procedure Date  . Inguinal hernia repair 20 years ago    bilateral  . Laparoscopy 06/18/2011    Procedure: LAPAROSCOPY DIAGNOSTIC;  Surgeon: Adolph Pollack, MD;  Location: [facility];  Service: General;  Laterality: N/A;  Laparoscopic irrigation of abdominal cavity with appendectomy  . Vasectomy 2001  . Appendectomy     History reviewed. No pertinent family history.  Social History History  Substance Use Topics  . Smoking status: Never Smoker   . Smokeless tobacco: Never Used  . Alcohol Use: Yes     social    No Known Allergies  Current Outpatient Prescriptions  Medication Sig Dispense Refill  . GuaiFENesin (COUGH SYRUP PO) Take 10 mLs by mouth.      Marland Kitchen ibuprofen (ADVIL) 200 MG tablet 2-3 tablets every 6 hours for pain as needed.  You may use plain tylenol or Percocet for pain also.  30 tablet  0  . ciprofloxacin (CIPRO) 750 MG tablet Take 1 tablet (750 mg total) by mouth 2 (two) times daily.  42 tablet  1  . metroNIDAZOLE (FLAGYL) 500 MG tablet Take 1 tablet (500 mg total) by mouth 3 (three) times daily.  42 tablet  1    Review of Systems Review of Systems  Constitutional: Positive for fever.  Gastrointestinal: Positive for abdominal pain  and abdominal distention.    Blood pressure 126/90, pulse 104, temperature 98.6 F (37 C), temperature source Temporal, resp. rate 14, height 6\' 2"  (1.88 m), weight 241 lb 6.4 oz (109.498 kg).  Physical Exam Physical Exam  Constitutional: He appears well-developed and well-nourished. No distress.  Abdominal: Soft. He exhibits mass. He exhibits no distension. There is tenderness (mild in lower abdomen).    Data Reviewed Hospital notes.  Assessment    Acute sigmoid diverticulitis with contained perforation status post drainage and antibiotic treatment. He did well but over the past 4-5 days starting to have some recurrent symptoms.    Plan    Restart Cipro and Flagyl.  Obtain a CT of the abdomen and pelvis and a CBC.  If the CT demonstrates diverticulitis and he is not better on oral antibiotics within 48 hours, he will need to be readmitted to the hospital and placed on IV antibiotics.  Otherwise, we'll see him back in 3 week       Paul Sloan J 07/17/2011, 5:38 PM

## 2011-07-18 ENCOUNTER — Inpatient Hospital Stay (HOSPITAL_COMMUNITY)
Admission: AD | Admit: 2011-07-18 | Discharge: 2011-07-21 | DRG: 182 | Disposition: A | Discharge status: Home / Self Care | Payer: BC Managed Care – PPO | Source: Ambulatory Visit | Attending: General Surgery | Admitting: General Surgery

## 2011-07-18 ENCOUNTER — Ambulatory Visit (INDEPENDENT_AMBULATORY_CARE_PROVIDER_SITE_OTHER): Payer: BC Managed Care – PPO | Admitting: General Surgery

## 2011-07-18 ENCOUNTER — Ambulatory Visit
Admission: RE | Admit: 2011-07-18 | Discharge: 2011-07-18 | Disposition: A | Discharge status: Home / Self Care | Payer: BC Managed Care – PPO | Source: Ambulatory Visit | Attending: General Surgery | Admitting: General Surgery

## 2011-07-18 ENCOUNTER — Encounter (HOSPITAL_COMMUNITY): Payer: Self-pay | Admitting: *Deleted

## 2011-07-18 ENCOUNTER — Encounter (INDEPENDENT_AMBULATORY_CARE_PROVIDER_SITE_OTHER): Payer: Self-pay | Admitting: General Surgery

## 2011-07-18 VITALS — BP 122/78 | HR 95 | Temp 99.2°F | Ht 74.0 in | Wt 236.4 lb

## 2011-07-18 DIAGNOSIS — K630 Abscess of intestine: Secondary | ICD-10-CM | POA: Diagnosis present

## 2011-07-18 DIAGNOSIS — Z09 Encounter for follow-up examination after completed treatment for conditions other than malignant neoplasm: Secondary | ICD-10-CM

## 2011-07-18 DIAGNOSIS — K5732 Diverticulitis of large intestine without perforation or abscess without bleeding: Principal | ICD-10-CM | POA: Diagnosis present

## 2011-07-18 DIAGNOSIS — K5792 Diverticulitis of intestine, part unspecified, without perforation or abscess without bleeding: Secondary | ICD-10-CM

## 2011-07-18 HISTORY — DX: Chronic sinusitis, unspecified: J32.9

## 2011-07-18 LAB — CBC WITH DIFFERENTIAL/PLATELET
Basophils Absolute: 0.1 10*3/uL (ref 0.0–0.1)
Basophils Relative: 0 % (ref 0–1)
Eosinophils Absolute: 0.1 10*3/uL (ref 0.0–0.7)
MCH: 30.8 pg (ref 26.0–34.0)
MCHC: 34.4 g/dL (ref 30.0–36.0)
Monocytes Relative: 13 % — ABNORMAL HIGH (ref 3–12)
Neutro Abs: 13.2 10*3/uL — ABNORMAL HIGH (ref 1.7–7.7)
Neutrophils Relative %: 74 % (ref 43–77)
Platelets: 358 10*3/uL (ref 150–400)
RDW: 12.4 % (ref 11.5–15.5)

## 2011-07-18 MED ORDER — ONDANSETRON HCL 4 MG/2ML IJ SOLN: 4.0000 mg | Freq: Four times a day (QID) | INTRAMUSCULAR | Status: DC | PRN

## 2011-07-18 MED ORDER — PANTOPRAZOLE SODIUM 40 MG IV SOLR
40.0000 mg | Freq: Every day | INTRAVENOUS | Status: DC
  Administered 2011-07-18 – 2011-07-20 (×3): 40 mg via INTRAVENOUS
  Filled 2011-07-18 (×4): qty 40

## 2011-07-18 MED ORDER — MORPHINE SULFATE 2 MG/ML IJ SOLN: 2.0000 mg | INTRAMUSCULAR | Status: DC | PRN

## 2011-07-18 MED ORDER — SODIUM CHLORIDE 0.9 % IV SOLN
1.0000 g | INTRAVENOUS | Status: DC
  Administered 2011-07-18 – 2011-07-20 (×3): 1 g via INTRAVENOUS
  Filled 2011-07-18 (×4): qty 1

## 2011-07-18 MED ORDER — IOHEXOL 300 MG/ML  SOLN
125.0000 mL | Freq: Once | INTRAMUSCULAR | Status: AC | PRN
  Administered 2011-07-18: 125 mL via INTRAVENOUS

## 2011-07-18 MED ORDER — KCL IN DEXTROSE-NACL 20-5-0.9 MEQ/L-%-% IV SOLN
INTRAVENOUS | Status: DC
  Administered 2011-07-18 – 2011-07-19 (×2): via INTRAVENOUS
  Administered 2011-07-19: 1000 mL via INTRAVENOUS
  Administered 2011-07-21: 09:00:00 via INTRAVENOUS
  Filled 2011-07-18 (×8): qty 1000

## 2011-07-18 NOTE — Progress Notes (Signed)
His white blood cell count is elevated and a 17,000 range. CT scan demonstrates a 6 cm pelvic abscess with recurrent sigmoid diverticulitis. Will directly admitted to hospital for IV antibiotics and interventional radiology consult for possible percutaneous drainage of the abscess. Full history and physical and orders have been done.

## 2011-07-18 NOTE — H&P (Signed)
Paul Sloan is an 51 y.o. male.   Chief Complaint:  Lower abdominal pain and fever HPI: He was in the hospital one month ago with sigmoid diverticulitis with a contained perforation. He underwent laparoscopy and was  noted to not have significant contamination at that time.  He had a washout of the abdomen as well as an incidental  appendectomy and drains were placed.  He was on IV antibiotics. He did well with this and was able to be discharged on oral antibiotics. I saw him in the office yesterday and he reported a 4 day history of fever as well as lower abdominal discomfort, constipation, and anorexia. He was sent for a CBC and a CT scan today. The CBCs demonstrated an elevated white count and a 17,000 range. The CT scan demonstrated a 6 cm pelvic abscess and changes consistent with diverticulitis. He is being admitted for further treatment.  He states the oral contrast made him go to the bathroom and he feels better from that standpoint.  Past Medical History  Diagnosis Date  . Diverticulitis of colon with perforation     Past Surgical History  Procedure Date  . Inguinal hernia repair 20 years ago    bilateral  . Laparoscopy 06/18/2011    Procedure: LAPAROSCOPY DIAGNOSTIC;  Surgeon: Burdett Pinzon J Cleston Lautner, MD;  Location: [facility];  Service: General;  Laterality: N/A;  Laparoscopic irrigation of abdominal cavity with appendectomy  . Vasectomy 2001  . Appendectomy     History reviewed. No pertinent family history. Social History:  reports that he has never smoked. He has never used smokeless tobacco. He reports that he drinks alcohol. He reports that he does not use illicit drugs.  Allergies: No Known Allergies   (Not in a hospital admission)  Results for orders placed in visit on 07/17/11 (from the past 48 hour(s))  CBC WITH DIFFERENTIAL     Status: Abnormal   Collection Time   07/17/11  4:43 PM      Component Value Range Comment   WBC 17.8 (*) 4.0 - 10.5 (K/uL)    RBC 4.39  4.22 -  5.81 (MIL/uL)    Hemoglobin 13.5  13.0 - 17.0 (g/dL)    HCT 39.2  39.0 - 52.0 (%)    MCV 89.3  78.0 - 100.0 (fL)    MCH 30.8  26.0 - 34.0 (pg)    MCHC 34.4  30.0 - 36.0 (g/dL)    RDW 12.4  11.5 - 15.5 (%)    Platelets 358  150 - 400 (K/uL)    Neutrophils Relative 74  43 - 77 (%)    Neutro Abs 13.2 (*) 1.7 - 7.7 (K/uL)    Lymphocytes Relative 12  12 - 46 (%)    Lymphs Abs 2.2  0.7 - 4.0 (K/uL)    Monocytes Relative 13 (*) 3 - 12 (%)    Monocytes Absolute 2.3 (*) 0.1 - 1.0 (K/uL)    Eosinophils Relative 1  0 - 5 (%)    Eosinophils Absolute 0.1  0.0 - 0.7 (K/uL)    Basophils Relative 0  0 - 1 (%)    Basophils Absolute 0.1  0.0 - 0.1 (K/uL)    Smear Review Criteria for review not met      Ct Abdomen Pelvis W Contrast  07/18/2011  *RADIOLOGY REPORT*  Clinical Data: Recurrent diverticulitis.  Recurrent left lower quadrant pain, fever, and chills.  Recent laparoscopic appendectomy and drainage for perforated diverticulitis.  CT ABDOMEN AND PELVIS   WITH CONTRAST  Technique:  Multidetector CT imaging of the abdomen and pelvis was performed following the standard protocol during bolus administration of intravenous contrast.  Contrast: 125mL OMNIPAQUE IOHEXOL 300 MG/ML  SOLN  Comparison: 06/18/2011  Findings: Mild to moderate sigmoid diverticulitis shows no significant change since previous study.  Tiny extraluminal air bubbles are seen in the pericolonic fat, also without significant change.  There is a new rim enhancing fluid collection in the region of the pelvic cul-de-sac, which measures 4.6 x 5.9 cm, and is consistent with a diverticular abscess.  There is no evidence of free intraperitoneal air.  Mild hepatic steatosis again demonstrated.  No liver masses are identified.  Cholelithiasis again noted, without evidence of acute cholecystitis or biliary dilatation.  The pancreas, spleen, adrenal glands, and kidneys are normal in appearance.  No evidence of hydronephrosis.  No soft tissue masses or  lymphadenopathy identified.  IMPRESSION:  1. No significant interval change in the appearance of sigmoid diverticulitis with microperforation. 2.  New abscess in the pelvic cul-de-sac area, measuring 4.6 x 5.9 cm. 3.  Stable mild hepatic steatosis and cholelithiasis.  Original Report Authenticated By: JOHN A. STAHL, M.D.    Review of Systems  Constitutional: Positive for fever and malaise/fatigue. Negative for chills.  Gastrointestinal: Positive for abdominal pain. Negative for nausea and vomiting.    Blood pressure 122/78, pulse 95, temperature 99.2 F (37.3 C), temperature source Temporal, height 6' 2" (1.88 m), weight 236 lb 6.4 oz (107.23 kg), SpO2 98.00%. Physical Exam  Constitutional:       Slightly ill-appearing male but in no acute distress.  HENT:  Head: Normocephalic and atraumatic.  Eyes: No scleral icterus.  Neck: Neck supple.  Cardiovascular: Normal rate and regular rhythm.   Respiratory: Effort normal and breath sounds normal.  GI: Soft. He exhibits no distension and no mass. There is tenderness (mild in lower abdomen). There is no guarding.  Musculoskeletal: He exhibits no edema.  Lymphadenopathy:    He has no cervical adenopathy.  Skin: Skin is warm and dry.     Assessment/Plan Recurrent sigmoid diverticulitis with pelvic abscess now.  Plan: Admit to the hospital.  Start broad-spectrum IV antibiotics. Obtain an interventional radiology consult for possible percutaneous drainage. I did discuss with him the possibility of a partial colectomy and colostomy this could not be treated adequately medically.  Reginae Wolfrey J 07/18/2011, 3:33 PM    

## 2011-07-19 ENCOUNTER — Inpatient Hospital Stay (HOSPITAL_COMMUNITY): Payer: BC Managed Care – PPO

## 2011-07-19 ENCOUNTER — Telehealth (INDEPENDENT_AMBULATORY_CARE_PROVIDER_SITE_OTHER): Payer: Self-pay | Admitting: General Surgery

## 2011-07-19 LAB — BASIC METABOLIC PANEL
CO2: 27 mEq/L (ref 19–32)
Chloride: 100 mEq/L (ref 96–112)
Glucose, Bld: 109 mg/dL — ABNORMAL HIGH (ref 70–99)
Potassium: 4.1 mEq/L (ref 3.5–5.1)
Sodium: 136 mEq/L (ref 135–145)

## 2011-07-19 LAB — CBC
Hemoglobin: 12.4 g/dL — ABNORMAL LOW (ref 13.0–17.0)
MCH: 30.5 pg (ref 26.0–34.0)
MCV: 92.6 fL (ref 78.0–100.0)
RBC: 4.06 MIL/uL — ABNORMAL LOW (ref 4.22–5.81)

## 2011-07-19 LAB — PROTIME-INR: Prothrombin Time: 14.7 seconds (ref 11.6–15.2)

## 2011-07-19 MED ORDER — ENOXAPARIN SODIUM 40 MG/0.4ML ~~LOC~~ SOLN
40.0000 mg | SUBCUTANEOUS | Status: DC
Start: 2011-07-19 — End: 2011-07-21
  Administered 2011-07-19 – 2011-07-20 (×2): 40 mg via SUBCUTANEOUS
  Filled 2011-07-19 (×4): qty 0.4

## 2011-07-19 MED ORDER — MIDAZOLAM HCL 5 MG/5ML IJ SOLN
INTRAMUSCULAR | Status: AC | PRN
  Administered 2011-07-19: 1 mg via INTRAVENOUS
  Administered 2011-07-19: 2 mg via INTRAVENOUS

## 2011-07-19 MED ORDER — FENTANYL CITRATE 0.05 MG/ML IJ SOLN
INTRAMUSCULAR | Status: AC | PRN
  Administered 2011-07-19: 50 ug via INTRAVENOUS
  Administered 2011-07-19: 100 ug via INTRAVENOUS

## 2011-07-19 NOTE — Procedures (Signed)
Successful CT guided transgluteal pelvic abscess aspiration Collection smaller than previous CT  10cc purulent fld aspirated GS/CX sent No comp Full report in PACS

## 2011-07-19 NOTE — H&P (View-Only) (Signed)
Paul Sloan is an 51 y.o. male.   Chief Complaint:  Lower abdominal pain and fever HPI: He was in the hospital one month ago with sigmoid diverticulitis with a contained perforation. He underwent laparoscopy and was  noted to not have significant contamination at that time.  He had a washout of the abdomen as well as an incidental  appendectomy and drains were placed.  He was on IV antibiotics. He did well with this and was able to be discharged on oral antibiotics. I saw him in the office yesterday and he reported a 4 day history of fever as well as lower abdominal discomfort, constipation, and anorexia. He was sent for a CBC and a CT scan today. The CBCs demonstrated an elevated white count and a 17,000 range. The CT scan demonstrated a 6 cm pelvic abscess and changes consistent with diverticulitis. He is being admitted for further treatment.  He states the oral contrast made him go to the bathroom and he feels better from that standpoint.  Past Medical History  Diagnosis Date  . Diverticulitis of colon with perforation     Past Surgical History  Procedure Date  . Inguinal hernia repair 20 years ago    bilateral  . Laparoscopy 06/18/2011    Procedure: LAPAROSCOPY DIAGNOSTIC;  Surgeon: Adolph Pollack, MD;  Location: [facility];  Service: General;  Laterality: N/A;  Laparoscopic irrigation of abdominal cavity with appendectomy  . Vasectomy 2001  . Appendectomy     History reviewed. No pertinent family history. Social History:  reports that he has never smoked. He has never used smokeless tobacco. He reports that he drinks alcohol. He reports that he does not use illicit drugs.  Allergies: No Known Allergies   (Not in a hospital admission)  Results for orders placed in visit on 07/17/11 (from the past 48 hour(s))  CBC WITH DIFFERENTIAL     Status: Abnormal   Collection Time   07/17/11  4:43 PM      Component Value Range Comment   WBC 17.8 (*) 4.0 - 10.5 (K/uL)    RBC 4.39  4.22 -  5.81 (MIL/uL)    Hemoglobin 13.5  13.0 - 17.0 (g/dL)    HCT 21.3  08.6 - 57.8 (%)    MCV 89.3  78.0 - 100.0 (fL)    MCH 30.8  26.0 - 34.0 (pg)    MCHC 34.4  30.0 - 36.0 (g/dL)    RDW 46.9  62.9 - 52.8 (%)    Platelets 358  150 - 400 (K/uL)    Neutrophils Relative 74  43 - 77 (%)    Neutro Abs 13.2 (*) 1.7 - 7.7 (K/uL)    Lymphocytes Relative 12  12 - 46 (%)    Lymphs Abs 2.2  0.7 - 4.0 (K/uL)    Monocytes Relative 13 (*) 3 - 12 (%)    Monocytes Absolute 2.3 (*) 0.1 - 1.0 (K/uL)    Eosinophils Relative 1  0 - 5 (%)    Eosinophils Absolute 0.1  0.0 - 0.7 (K/uL)    Basophils Relative 0  0 - 1 (%)    Basophils Absolute 0.1  0.0 - 0.1 (K/uL)    Smear Review Criteria for review not met      Ct Abdomen Pelvis W Contrast  07/18/2011  *RADIOLOGY REPORT*  Clinical Data: Recurrent diverticulitis.  Recurrent left lower quadrant pain, fever, and chills.  Recent laparoscopic appendectomy and drainage for perforated diverticulitis.  CT ABDOMEN AND PELVIS  WITH CONTRAST  Technique:  Multidetector CT imaging of the abdomen and pelvis was performed following the standard protocol during bolus administration of intravenous contrast.  Contrast: OMNIPAQUE IOHEXOL 300 MG/ML  SOLN  Comparison: 06/18/2011  Findings: Mild to moderate sigmoid diverticulitis shows no significant change since previous study.  Tiny extraluminal air bubbles are seen in the pericolonic fat, also without significant change.  There is a new rim enhancing fluid collection in the region of the pelvic cul-de-sac, which measures 4.6 x 5.9 cm, and is consistent with a diverticular abscess.  There is no evidence of free intraperitoneal air.  Mild hepatic steatosis again demonstrated.  No liver masses are identified.  Cholelithiasis again noted, without evidence of acute cholecystitis or biliary dilatation.  The pancreas, spleen, adrenal glands, and kidneys are normal in appearance.  No evidence of hydronephrosis.  No soft tissue masses or  lymphadenopathy identified.  IMPRESSION:  1. No significant interval change in the appearance of sigmoid diverticulitis with microperforation. 2.  New abscess in the pelvic cul-de-sac area, measuring 4.6 x 5.9 cm. 3.  Stable mild hepatic steatosis and cholelithiasis.  Original Report Authenticated By: Danae Orleans, M.D.    Review of Systems  Constitutional: Positive for fever and malaise/fatigue. Negative for chills.  Gastrointestinal: Positive for abdominal pain. Negative for nausea and vomiting.    Blood pressure 122/78, pulse 95, temperature 99.2 F (37.3 C), temperature source Temporal, height 6\' 2"  (1.88 m), weight 236 lb 6.4 oz (107.23 kg), SpO2 98.00%. Physical Exam  Constitutional:       Slightly ill-appearing male but in no acute distress.  HENT:  Head: Normocephalic and atraumatic.  Eyes: No scleral icterus.  Neck: Neck supple.  Cardiovascular: Normal rate and regular rhythm.   Respiratory: Effort normal and breath sounds normal.  GI: Soft. He exhibits no distension and no mass. There is tenderness (mild in lower abdomen). There is no guarding.  Musculoskeletal: He exhibits no edema.  Lymphadenopathy:    He has no cervical adenopathy.  Skin: Skin is warm and dry.     Assessment/Plan Recurrent sigmoid diverticulitis with pelvic abscess now.  Plan: Admit to the hospital.  Start broad-spectrum IV antibiotics. Obtain an interventional radiology consult for possible percutaneous drainage. I did discuss with him the possibility of a partial colectomy and colostomy this could not be treated adequately medically.  Drue Harr J 07/18/2011, 3:33 PM

## 2011-07-19 NOTE — Interval H&P Note (Signed)
History and Physical Interval Note:  07/19/2011 11:10 AM  Paul Sloan  Is scheduled for CT guided drainage of pelvic abscess today secondary to recurrent sigmoid diverticulitis with pelvic abscess formation. The various methods of treatment have been discussed with the patient and family. After consideration of risks, benefits and other options for treatment, the patient has consented to the above procedure.  The patients' history has been reviewed, patient examined, no change in status, stable for the above procedure. Chest- CTA bilat, Heart- RRR, abd- soft, + BS, mild lower abd/pelvic tenderness.  I have reviewed the patients' chart and labs.  Questions were answered to the patient's satisfaction.   Past Medical History  Diagnosis Date  . Diverticulitis of colon with perforation   . Sinusitis    Past Surgical History  Procedure Date  . Inguinal hernia repair 20 years ago    bilateral  . Laparoscopy 06/18/2011    Procedure: LAPAROSCOPY DIAGNOSTIC;  Surgeon: Adolph Pollack, MD;  Location: [facility];  Service: General;  Laterality: N/A;  Laparoscopic irrigation of abdominal cavity with appendectomy  . Vasectomy 2001  . Appendectomy   . Diverticulium surgery 06/19/2011  . Rhinoplasty    Ct Abdomen Pelvis W Contrast  07/18/2011  *RADIOLOGY REPORT*  Clinical Data: Recurrent diverticulitis.  Recurrent left lower quadrant pain, fever, and chills.  Recent laparoscopic appendectomy and drainage for perforated diverticulitis.  CT ABDOMEN AND PELVIS WITH CONTRAST  Technique:  Multidetector CT imaging of the abdomen and pelvis was performed following the standard protocol during bolus administration of intravenous contrast.  Contrast: OMNIPAQUE IOHEXOL 300 MG/ML  SOLN  Comparison: 06/18/2011  Findings: Mild to moderate sigmoid diverticulitis shows no significant change since previous study.  Tiny extraluminal air bubbles are seen in the pericolonic fat, also without significant change.  There is  a new rim enhancing fluid collection in the region of the pelvic cul-de-sac, which measures 4.6 x 5.9 cm, and is consistent with a diverticular abscess.  There is no evidence of free intraperitoneal air.  Mild hepatic steatosis again demonstrated.  No liver masses are identified.  Cholelithiasis again noted, without evidence of acute cholecystitis or biliary dilatation.  The pancreas, spleen, adrenal glands, and kidneys are normal in appearance.  No evidence of hydronephrosis.  No soft tissue masses or lymphadenopathy identified.  IMPRESSION:  1. No significant interval change in the appearance of sigmoid diverticulitis with microperforation. 2.  New abscess in the pelvic cul-de-sac area, measuring 4.6 x 5.9 cm. 3.  Stable mild hepatic steatosis and cholelithiasis.  Original Report Authenticated By: Danae Orleans, M.D.  Results for orders placed during the hospital encounter of 07/18/11  APTT      Component Value Range   aPTT 33  24 - 37 (seconds)  BASIC METABOLIC PANEL      Component Value Range   Sodium 136  135 - 145 (mEq/L)   Potassium 4.1  3.5 - 5.1 (mEq/L)   Chloride 100  96 - 112 (mEq/L)   CO2 27  19 - 32 (mEq/L)   Glucose, Bld 109 (*) 70 - 99 (mg/dL)   BUN 7  6 - 23 (mg/dL)   Creatinine, Ser 1.61  0.50 - 1.35 (mg/dL)   Calcium 9.4  8.4 - 09.6 (mg/dL)   GFR calc non Af Amer 80 (*) >90 (mL/min)   GFR calc Af Amer >90  >90 (mL/min)  CBC      Component Value Range   WBC 11.4 (*) 4.0 - 10.5 (K/uL)  RBC 4.06 (*) 4.22 - 5.81 (MIL/uL)   Hemoglobin 12.4 (*) 13.0 - 17.0 (g/dL)   HCT 16.1 (*) 09.6 - 52.0 (%)   MCV 92.6  78.0 - 100.0 (fL)   MCH 30.5  26.0 - 34.0 (pg)   MCHC 33.0  30.0 - 36.0 (g/dL)   RDW 04.5  40.9 - 81.1 (%)   Platelets 310  150 - 400 (K/uL)  PROTIME-INR      Component Value Range   Prothrombin Time 14.7  11.6 - 15.2 (seconds)   INR 1.13  0.00 - 1.49      Paul Sloan,D KEVIN

## 2011-07-19 NOTE — Progress Notes (Signed)
  Subjective: He feels better, some relief with just the contrast and BM's.  He's been to Radiology and they aspirated 10 ml purulent fluid and he's back now.  Objective: Vital signs in last 24 hours: Temp:  [98.3 F (36.8 C)-99.2 F (37.3 C)] 98.7 F (37.1 C) (06/05 0544) Pulse Rate:  [69-95] 69  (06/05 0544) Resp:  [18] 18  (06/05 0544) BP: (114-122)/(74-80) 114/74 mmHg (06/05 0544) SpO2:  [98 %-100 %] 99 % (06/05 0544) Weight:  [107.23 kg (236 lb 6.4 oz)] 107.23 kg (236 lb 6.4 oz) (06/04 1700) Last BM Date: 07/18/11 Tm 99, VSS, WBC is better.  Intake/Output from previous day: 06/04 0701 - 06/05 0700 In: 1556 [P.O.:300; I.V.:1206; IV Piggyback:50] Out: -  Intake/Output this shift:    General appearance: alert, cooperative and no distress GI: soft, non-tender; bowel sounds normal; no masses,  no organomegaly  Lab Results:   Basename 07/19/11 0345 07/17/11 1643  WBC 11.4* 17.8*  HGB 12.4* 13.5  HCT 37.6* 39.2  PLT 310 358    BMET  Basename 07/19/11 0345  NA 136  K 4.1  CL 100  CO2 27  GLUCOSE 109*  BUN 7  CREATININE 1.04  CALCIUM 9.4   PT/INR  Basename 07/19/11 0345  LABPROT 14.7  INR 1.13    No results found for this basename: AST:5,ALT:5,ALKPHOS:5,BILITOT:5,PROT:5,ALBUMIN:5 in the last 168 hours   Lipase  No results found for this basename: lipase     Studies/Results: Ct Abdomen Pelvis W Contrast  07/18/2011  *RADIOLOGY REPORT*  Clinical Data: Recurrent diverticulitis.  Recurrent left lower quadrant pain, fever, and chills.  Recent laparoscopic appendectomy and drainage for perforated diverticulitis.  CT ABDOMEN AND PELVIS WITH CONTRAST  Technique:  Multidetector CT imaging of the abdomen and pelvis was performed following the standard protocol during bolus administration of intravenous contrast.  Contrast: OMNIPAQUE IOHEXOL 300 MG/ML  SOLN  Comparison: 06/18/2011  Findings: Mild to moderate sigmoid diverticulitis shows no significant change  since previous study.  Tiny extraluminal air bubbles are seen in the pericolonic fat, also without significant change.  There is a new rim enhancing fluid collection in the region of the pelvic cul-de-sac, which measures 4.6 x 5.9 cm, and is consistent with a diverticular abscess.  There is no evidence of free intraperitoneal air.  Mild hepatic steatosis again demonstrated.  No liver masses are identified.  Cholelithiasis again noted, without evidence of acute cholecystitis or biliary dilatation.  The pancreas, spleen, adrenal glands, and kidneys are normal in appearance.  No evidence of hydronephrosis.  No soft tissue masses or lymphadenopathy identified.  IMPRESSION:  1. No significant interval change in the appearance of sigmoid diverticulitis with microperforation. 2.  New abscess in the pelvic cul-de-sac area, measuring 4.6 x 5.9 cm. 3.  Stable mild hepatic steatosis and cholelithiasis.  Original Report Authenticated By: Danae Orleans, M.D.    Medications:    . ertapenem (INVANZ) IV  1 g Intravenous Q24H  . pantoprazole (PROTONIX) IV  40 mg Intravenous QHS    Assessment/Plan Recurrent sigmoid diverticulitis with pelvic abscess now.  Perforated sigmoid diverticulitis with periappendicitis s/p Laparoscopic exploration and irrigation of the abdominal cavity with appendectomy and placement of drains, 06/19/11 DR. Rosenbower.    Plan:  Continue antibiotics, Lovenox DVT prophylaxis   .    LOS: 1 day    Ansen Sayegh 07/19/2011

## 2011-07-19 NOTE — Progress Notes (Signed)
Patient interviewed and examined, agree with PA note above. He feels significantly better today with antibiotics and following aspiration of the small pelvic abscess. Continue antibiotics and bowel rest for now. Mariella Saa MD, FACS  07/19/2011 7:55 PM

## 2011-07-19 NOTE — Progress Notes (Signed)
Mr Paul Sloan  reported to me some pink puss-like stool x2 , Denies any pain . Notified the on call doctor at [facility], no new order received,

## 2011-07-19 NOTE — Telephone Encounter (Signed)
Vera called in stating she needed post op orders for this patient. Advised to send a staff message to Dr. Abbey Chatters in [place], and I would forward a message to his nurse as well.

## 2011-07-20 NOTE — Progress Notes (Signed)
Patient interviewed and examined, agree with PA note above. He is clearly much improved. May be able to go home soon and continue treatment with oral antibiotics. Mariella Saa MD, FACS  07/20/2011 2:49 PM

## 2011-07-20 NOTE — Progress Notes (Signed)
  Subjective: He feels better, Stool is returning to normal.  Objective: Vital signs in last 24 hours: Temp:  [98.3 F (36.8 C)-98.7 F (37.1 C)] 98.4 F (36.9 C) (06/06 0527) Pulse Rate:  [63-74] 74  (06/06 0527) Resp:  [16-18] 18  (06/06 0527) BP: (98-123)/(67-73) 109/70 mmHg (06/06 0527) SpO2:  [91 %-98 %] 98 % (06/06 0527) Last BM Date: 07/20/11 (pt states "just a little of the tissuey stuff"; pink)  Afebrile, VSS, no labs  Intake/Output from previous day: 06/05 0701 - 06/06 0700 In: 1325 [I.V.:1325] Out: -  Intake/Output this shift: Total I/O In: 720 [P.O.:720] Out: -   General appearance: alert, cooperative and no distress GI: soft, non-tender; bowel sounds normal; no masses,  no organomegaly  Lab Results:   Basename 07/19/11 0345 07/17/11 1643  WBC 11.4* 17.8*  HGB 12.4* 13.5  HCT 37.6* 39.2  PLT 310 358    BMET  Basename 07/19/11 0345  NA 136  K 4.1  CL 100  CO2 27  GLUCOSE 109*  BUN 7  CREATININE 1.04  CALCIUM 9.4   PT/INR  Basename 07/19/11 0345  LABPROT 14.7  INR 1.13    No results found for this basename: AST:5,ALT:5,ALKPHOS:5,BILITOT:5,PROT:5,ALBUMIN:5 in the last 168 hours   Lipase  No results found for this basename: lipase     Studies/Results: Ct Aspiration  07/19/2011  *RADIOLOGY REPORT*  Clinical Data: Pelvic diverticular abscess  CT GUIDED TRANSGLUTEAL PELVIC ABSCESS NEEDLE ASPIRATION  Date:  07/19/2011 11:45:00  Radiologist:  Judie Petit. Ruel Favors, M.D.  Medications:  3 mg Versed, 150 mcg Fentanyl  Guidance:  CT  Fluoroscopy time:  None.  Sedation time:  15 minutes  Contrast volume:  None.  Complications:  No immediate  PROCEDURE/FINDINGS:  Informed consent was obtained from the patient following explanation of the procedure, risks, benefits and alternatives. The patient understands, agrees and consents for the procedure. All questions were addressed.  A time out was performed.  Maximal barrier sterile technique utilized including caps,  mask, sterile gowns, sterile gloves, large sterile drape, hand hygiene, and betadine  Previous CT reviewed.  Small pelvic fluid collection demonstrated between the bladder and rectum.  The patient was positioned prone. Noncontrast localization CT performed through the pelvis.  The small fluid collection was localized.  Under sterile conditions and local anesthesia, an 18 gauge Hawkins needle was utilized from a right transgluteal oblique approach to access the small collection. Needle position confirmed with CT.  Syringe aspiration yielded 10 ml of purulent fluid.  Samples sent for Gram stain and culture. This completely decompressed the small abscess.  No immediate complication. The patient tolerated the procedure well.  IMPRESSION: Successful CT guided pelvic abscess needle aspiration.  Sample sent for Gram stain and culture.  Original Report Authenticated By: Judie Petit. Ruel Favors, M.D.    Medications:    . enoxaparin (LOVENOX) injection  40 mg Subcutaneous Q24H  . ertapenem (INVANZ) IV  1 g Intravenous Q24H  . pantoprazole (PROTONIX) IV  40 mg Intravenous QHS    Assessment/Plan Recurrent sigmoid diverticulitis with pelvic abscess now.  Perforated sigmoid diverticulitis with periappendicitis s/p Laparoscopic exploration and irrigation of the abdominal cavity with appendectomy and placement of drains, 06/19/11 DR. Rosenbower.    Plan:  Continue IV antibiotics and clear liquids. Decide how long on abx, when to repeat CT and be able to go home.  I will check labs in AM   LOS: 2 days    Paul Sloan 07/20/2011

## 2011-07-21 LAB — COMPREHENSIVE METABOLIC PANEL
ALT: 36 U/L (ref 0–53)
Albumin: 3 g/dL — ABNORMAL LOW (ref 3.5–5.2)
Calcium: 9.4 mg/dL (ref 8.4–10.5)
GFR calc Af Amer: >90 mL/min (ref >90)
Glucose, Bld: 99 mg/dL (ref 70–99)
Potassium: 4 mEq/L (ref 3.5–5.1)
Sodium: 138 mEq/L (ref 135–145)
Total Protein: 6.9 g/dL (ref 6.0–8.3)

## 2011-07-21 LAB — CBC
Hemoglobin: 13.2 g/dL (ref 13.0–17.0)
MCH: 29.9 pg (ref 26.0–34.0)
MCHC: 32.8 g/dL (ref 30.0–36.0)
RDW: 12 % (ref 11.5–15.5)

## 2011-07-21 MED ORDER — AMOXICILLIN-POT CLAVULANATE 875-125 MG PO TABS: 1.0000 | ORAL_TABLET | Freq: Two times a day (BID) | ORAL | Status: AC

## 2011-07-21 MED ORDER — AMOXICILLIN-POT CLAVULANATE 875-125 MG PO TABS
1.0000 | ORAL_TABLET | Freq: Two times a day (BID) | ORAL | Status: DC
  Filled 2011-07-21 (×2): qty 1

## 2011-07-21 MED ORDER — OXYCODONE-ACETAMINOPHEN 5-325 MG PO TABS: 1.0000 | ORAL_TABLET | ORAL | Status: DC | PRN

## 2011-07-21 MED ORDER — OXYCODONE-ACETAMINOPHEN 5-325 MG PO TABS: 1.0000 | ORAL_TABLET | ORAL | Status: AC | PRN

## 2011-07-21 NOTE — Progress Notes (Signed)
Subjective: Feels better, BM still variable looking stools. No pain.  Objective: Vital signs in last 24 hours: Temp:  [98.1 F (36.7 C)-98.9 F (37.2 C)] 98.3 F (36.8 C) (06/07 0542) Pulse Rate:  [58-68] 68  (06/07 0542) Resp:  [16-20] 16  (06/07 0542) BP: (110-131)/(70-81) 110/70 mmHg (06/07 0542) SpO2:  [96 %-99 %] 96 % (06/07 0542) Last BM Date: 07/21/11  1080 PO, afebrile, VSS, Labs back to normal  WBC 8.7, on Clears  Intake/Output from previous day: 06/06 0701 - 06/07 0700 In: 3068.3 [P.O.:1080; I.V.:1988.3] Out: -  Intake/Output this shift: Total I/O In: 240 [P.O.:240] Out: -   General appearance: alert, appears stated age and no distress GI: soft, non-tender; bowel sounds normal; no masses,  no organomegaly  Lab Results:   [facility] 07/21/11 0345 07/19/11 0345  WBC 8.7 11.4*  HGB 13.2 12.4*  HCT 40.2 37.6*  PLT 326 310    BMET  Basename 07/21/11 0345 07/19/11 0345  NA 138 136  K 4.0 4.1  CL 103 100  CO2 25 27  GLUCOSE 99 109*  BUN 6 7  CREATININE 0.99 1.04  CALCIUM 9.4 9.4   PT/INR  Basename 07/19/11 0345  LABPROT 14.7  INR 1.13     Lab 07/21/11 0345  AST 22  ALT 36  ALKPHOS 91  BILITOT 0.3  PROT 6.9  ALBUMIN 3.0*     Lipase  No results found for this basename: lipase     Studies/Results: Ct Aspiration  07/19/2011  *RADIOLOGY REPORT*  Clinical Data: Pelvic diverticular abscess  CT GUIDED TRANSGLUTEAL PELVIC ABSCESS NEEDLE ASPIRATION  Date:  07/19/2011 11:45:00  Radiologist:  Judie Petit. Ruel Favors, M.D.  Medications:  3 mg Versed, 150 mcg Fentanyl  Guidance:  CT  Fluoroscopy time:  None.  Sedation time:  15 minutes  Contrast volume:  None.  Complications:  No immediate  PROCEDURE/FINDINGS:  Informed consent was obtained from the patient following explanation of the procedure, risks, benefits and alternatives. The patient understands, agrees and consents for the procedure. All questions were addressed.  A time out was performed.  Maximal  barrier sterile technique utilized including caps, mask, sterile gowns, sterile gloves, large sterile drape, hand hygiene, and betadine  Previous CT reviewed.  Small pelvic fluid collection demonstrated between the bladder and rectum.  The patient was positioned prone. Noncontrast localization CT performed through the pelvis.  The small fluid collection was localized.  Under sterile conditions and local anesthesia, an 18 gauge Hawkins needle was utilized from a right transgluteal oblique approach to access the small collection. Needle position confirmed with CT.  Syringe aspiration yielded 10 ml of purulent fluid.  Samples sent for Gram stain and culture. This completely decompressed the small abscess.  No immediate complication. The patient tolerated the procedure well.  IMPRESSION: Successful CT guided pelvic abscess needle aspiration.  Sample sent for Gram stain and culture.  Original Report Authenticated By: Judie Petit. Ruel Favors, M.D.    Medications:    . enoxaparin (LOVENOX) injection  40 mg Subcutaneous Q24H  . ertapenem (INVANZ) IV  1 g Intravenous Q24H  . pantoprazole (PROTONIX) IV  40 mg Intravenous QHS    Assessment/Plan Recurrent sigmoid diverticulitis with pelvic abscess now.  Perforated sigmoid diverticulitis with periappendicitis s/p Laparoscopic exploration and irrigation of the abdominal cavity with appendectomy and placement of drains, 06/19/11 DR. Rosenbower.    Plan:  Change to oral Augmentin and send home today. 10 days of Augmentin follow up with Dr. Abbey Chatters.  LOS: 3 days    Paul Sloan 07/21/2011

## 2011-07-21 NOTE — Discharge Summary (Signed)
Physician Discharge Summary  Patient ID: Paul Sloan MRN: [id number] DOB/AGE: 08-12-1960 51 y.o.  Admit date: 07/18/2011 Discharge date: 07/21/2011  Admission Diagnoses: Recurrent sigmoid diverticulitis with pelvic abscess now.      Discharge Diagnoses: Same Active Problems:  * No active hospital problems. *    PROCEDURES: CT guided transgluteal pelvic abscess aspiration 07/19/11 IR Dr[facility] Course: He was in the hospital one month ago with sigmoid diverticulitis with a contained perforation. He underwent laparoscopy and was noted to not have significant contamination at that time. He had a washout of the abdomen as well as an incidental appendectomy and drains were placed. He was on IV antibiotics. He did well with this and was able to be discharged on oral antibiotics. I saw him in the office yesterday and he reported a 4 day history of fever as well as lower abdominal discomfort, constipation, and anorexia. He was sent for a CBC and a CT scan today. The CBCs demonstrated an elevated white count and a 17,000 range. The CT scan demonstrated a 6 cm pelvic abscess and changes consistent with diverticulitis. He is being admitted for further treatment. He states the oral contrast made him go to the bathroom and he feels better from that standpoint. He was place on Invanz and admitted.  IR did a percutaneous aspiration, but did not leave a drain.  He was place on clear liquids, and has done well.  No pain, WBC is normal.  We plan to send home on 10 days of Augmentin and follow up with Dr Abbey Chatters after antibiotics are completed.  He will call sooner if he has a problem. Condition on D/C:  Improving   Disposition: 01-Home or Self Care  Discharge Orders    Future Appointments: Provider: Department: Dept Phone: Center:   08/21/2011 9:50 AM Adolph Pollack, MD [facility] [phone] None     Medication List  As of 07/21/2011 12:30 PM   STOP taking these medications       ciprofloxacin 750 MG tablet      metroNIDAZOLE 500 MG tablet         TAKE these medications         amoxicillin-clavulanate 875-125 MG per tablet   Commonly known as: AUGMENTIN   Take 1 tablet by mouth every 12 (twelve) hours.      COUGH SYRUP PO   Take 10 mLs by mouth as needed.      ibuprofen 200 MG tablet   Commonly known as: ADVIL,MOTRIN   2-3 tablets every 6 hours for pain as needed.  You may use plain tylenol or Percocet for pain also.      loratadine 10 MG tablet   Commonly known as: CLARITIN   Take 10 mg by mouth daily as needed.      oxyCODONE-acetaminophen 5-325 MG per tablet   Commonly known as: PERCOCET   Take 1-2 tablets by mouth every 4 (four) hours as needed.           Follow-up Information    Follow up with ROSENBOWER,TODD J, MD. Schedule an appointment as soon as possible for a visit in 2 weeks. (Call and see sooner if needed.)    Contact information:   [facility] [address] [place] [address] [phone]          Signed: Sherrie George 07/21/2011, 12:30 PM

## 2011-07-21 NOTE — Discharge Instructions (Signed)
Diverticulitis A diverticulum is a small pouch or sac on the colon. Diverticulosis is the presence of these diverticula on the colon. Diverticulitis is the irritation (inflammation) or infection of diverticula. CAUSES  The colon and its diverticula contain bacteria. If food particles block the tiny opening to a diverticulum, the bacteria inside can grow and cause an increase in pressure. This leads to infection and inflammation and is called diverticulitis. SYMPTOMS   Abdominal pain and tenderness. Usually, the pain is located on the left side of your abdomen. However, it could be located elsewhere.   Fever.   Bloating.   Feeling sick to your stomach (nausea).   Throwing up (vomiting).   Abnormal stools.  DIAGNOSIS  Your caregiver will take a history and perform a physical exam. Since many things can cause abdominal pain, other tests may be necessary. Tests may include:  Blood tests.   Urine tests.   X-ray of the abdomen.   CT scan of the abdomen.  Sometimes, surgery is needed to determine if diverticulitis or other conditions are causing your symptoms. TREATMENT  Most of the time, you can be treated without surgery. Treatment includes:  Resting the bowels by only having liquids for a few days. As you improve, you will need to eat a low-fiber diet.   Intravenous (IV) fluids if you are losing body fluids (dehydrated).   Antibiotic medicines that treat infections may be given.   Pain and nausea medicine, if needed.   Surgery if the inflamed diverticulum has burst.  HOME CARE INSTRUCTIONS   Try a clear liquid diet (broth, tea, or water for as long as directed by your caregiver). You may then gradually begin a low-fiber diet as tolerated. A low-fiber diet is a diet with less than 10 grams of fiber. Choose the foods below to reduce fiber in the diet:   White breads, cereals, rice, and pasta.   Cooked fruits and vegetables or soft fresh fruits and vegetables without the skin.     Ground or well-cooked tender beef, ham, veal, lamb, pork, or poultry.   Eggs and seafood.   After your diverticulitis symptoms have improved, your caregiver may put you on a high-fiber diet. A high-fiber diet includes 14 grams of fiber for every 1000 calories consumed. For a standard 2000 calorie diet, you would need 28 grams of fiber. Follow these diet guidelines to help you increase the fiber in your diet. It is important to slowly increase the amount fiber in your diet to avoid gas, constipation, and bloating.   Choose whole-grain breads, cereals, pasta, and brown rice.   Choose fresh fruits and vegetables with the skin on. Do not overcook vegetables because the more vegetables are cooked, the more fiber is lost.   Choose more nuts, seeds, legumes, dried peas, beans, and lentils.   Look for food products that have greater than 3 grams of fiber per serving on the Nutrition Facts label.   Take all medicine as directed by your caregiver.   If your caregiver has given you a follow-up appointment, it is very important that you go. Not going could result in lasting (chronic) or permanent injury, pain, and disability. If there is any problem keeping the appointment, call to reschedule.  SEEK MEDICAL CARE IF:   Your pain does not improve.   You have a hard time advancing your diet beyond clear liquids.   Your bowel movements do not return to normal.  SEEK IMMEDIATE MEDICAL CARE IF:   Your pain becomes   worse.   You have an oral temperature above 102 F (38.9 C), not controlled by medicine.   You have repeated vomiting.   You have bloody or black, tarry stools.   Symptoms that brought you to your caregiver become worse or are not getting better.  MAKE SURE YOU:   Understand these instructions.   Will watch your condition.   Will get help right away if you are not doing well or get worse.  Document Released: 11/09/2004 Document Revised: 01/19/2011 Document Reviewed:  03/07/2010 [employer] Patient Information 2012 [employer]. 

## 2011-07-23 LAB — CULTURE, ROUTINE-ABSCESS

## 2011-07-24 LAB — ANAEROBIC CULTURE

## 2011-08-08 ENCOUNTER — Encounter (INDEPENDENT_AMBULATORY_CARE_PROVIDER_SITE_OTHER): Payer: Self-pay | Admitting: General Surgery

## 2011-08-08 ENCOUNTER — Ambulatory Visit (INDEPENDENT_AMBULATORY_CARE_PROVIDER_SITE_OTHER): Payer: BC Managed Care – PPO | Admitting: General Surgery

## 2011-08-08 VITALS — BP 118/82 | HR 66 | Temp 97.4°F | Resp 18 | Ht 74.0 in | Wt 237.0 lb

## 2011-08-08 DIAGNOSIS — K5732 Diverticulitis of large intestine without perforation or abscess without bleeding: Secondary | ICD-10-CM

## 2011-08-08 NOTE — Patient Instructions (Signed)
Stay on current diet.  Activities as tolerated.

## 2011-08-08 NOTE — Progress Notes (Signed)
Subjective:     Patient ID: Lamere Lightner, male   DOB: Sep 30, 1960, 51 y.o.   MRN: [id number]  HPI He is here for another followup visit. He was readmitted to the hospital and found to have intra-abdominal abscess which was percutaneously aspirated. He responded to intravenous antibiotics and was sent home on Augmentin. He has been off that for about 8 days and is doing well. He is on a low fiber diet and tolerating that.  Review of Systems  He is having bowel movements every day without difficulty. No fever or chills. No abdominal pain   Objective:   Physical Exam Gen.-he looks well and is in no acute distress. He is afebrile.  Abdomen-soft, nontender, multiple small scars present.    Assessment:     Complicated sigmoid diverticulitis with perforation and abscess. He seems to be better at this time.   Plan:     Continue low fiber diet. Activities as tolerated. We'll arrange for him to have a barium enema in mid July and a followup appointment with me. At that time we will schedule him for elective partial colectomy.

## 2011-08-09 ENCOUNTER — Other Ambulatory Visit (INDEPENDENT_AMBULATORY_CARE_PROVIDER_SITE_OTHER): Payer: Self-pay | Admitting: General Surgery

## 2011-08-09 DIAGNOSIS — K5790 Diverticulosis of intestine, part unspecified, without perforation or abscess without bleeding: Secondary | ICD-10-CM

## 2011-08-21 ENCOUNTER — Encounter (INDEPENDENT_AMBULATORY_CARE_PROVIDER_SITE_OTHER): Payer: BC Managed Care – PPO | Admitting: General Surgery

## 2011-08-30 ENCOUNTER — Ambulatory Visit (HOSPITAL_COMMUNITY)
Admission: RE | Admit: 2011-08-30 | Discharge: 2011-08-30 | Disposition: A | Discharge status: Home / Self Care | Payer: BC Managed Care – PPO | Source: Ambulatory Visit | Attending: General Surgery | Admitting: General Surgery

## 2011-08-30 DIAGNOSIS — K5730 Diverticulosis of large intestine without perforation or abscess without bleeding: Secondary | ICD-10-CM | POA: Insufficient documentation

## 2011-08-30 DIAGNOSIS — K5790 Diverticulosis of intestine, part unspecified, without perforation or abscess without bleeding: Secondary | ICD-10-CM

## 2011-09-01 ENCOUNTER — Telehealth (INDEPENDENT_AMBULATORY_CARE_PROVIDER_SITE_OTHER): Payer: Self-pay

## 2011-09-01 NOTE — Telephone Encounter (Signed)
Pt notified of CT results and confirmed f/u appointment on 09/20/11 w/ Dr.Rosenbower.

## 2011-09-20 ENCOUNTER — Ambulatory Visit (INDEPENDENT_AMBULATORY_CARE_PROVIDER_SITE_OTHER): Payer: BC Managed Care – PPO | Admitting: General Surgery

## 2011-09-20 ENCOUNTER — Encounter (INDEPENDENT_AMBULATORY_CARE_PROVIDER_SITE_OTHER): Payer: Self-pay | Admitting: General Surgery

## 2011-09-20 VITALS — BP 134/84 | HR 72 | Temp 98.4°F | Ht 74.0 in | Wt 237.4 lb

## 2011-09-20 DIAGNOSIS — K5732 Diverticulitis of large intestine without perforation or abscess without bleeding: Secondary | ICD-10-CM

## 2011-09-20 NOTE — Patient Instructions (Signed)
[facility]  ONE-DAY (1) PRE-OP HOME COLON PREP INSTRUCTIONS: ** MIRALAX / GATORADE PREP **  Fill the two prescriptions at a pharmacy of your choice.  You must follow the instructions below carefully.  If you have questions or problems, please call and speak to someone in the clinic department at our office:   [phone].  MIRALAX - GATORADE -- DULCOLAX TABS:   Fill the prescriptions for MIRALAX  (255 gm bottle)    In addition, purchase four (4) DULCOLAX TABLETS (no prescription required), and one 64 oz GATORADE.  (Do NOT purchase red Gatorade; any other flavor is acceptable).  ANITIBIOTICS:   There will be 2 different antibiotics.     Take both prescriptions THE AFTERNOON BEFORE your surgery, at the times written on the bottles.  INSTRUCTIONS: 1. Five days prior to your procedure do not eat nuts, popcorn, or fruit with seeds.  Stop all fiber supplements such as Metamucil, Citrucel, etc.  2. The day before your procedure: o 6:00am:  take (4) Dulcolax tablets.  You should remain on clear liquids for the entire day.   CLEAR LIQUIDS: clear bouillon, broth, jello (NOT RED), black coffee, tea, soda, etc o 10:00am:  add the bottle of MiraLax to the 64-oz bottle of Gatorade, and dissolve.  Begin drinking the Gatorade mixture until gone (8 oz every 15-30 minutes).  Continue clear liquids until midnight (or bedtime). o Take the antibiotics at the times instructed on the bottles.  3. The day of your procedure:   Do not eat or drink ANYTHING after midnight before your surgery.     If you take Heart or Blood Pressure medicine, ask the pre-op nurses about these during your preop appointment.   Further pre-operative instructions will be given to you from the hospital.   Expect to be contacted 5-7 days before your surgery.       

## 2011-09-20 NOTE — Progress Notes (Signed)
Patient ID: Paul Sloan, male   DOB: 10-23-1960, 51 y.o.   MRN: [id number]  Chief Complaint  Patient presents with  . Follow-up    reck after CT    HPI Paul Sloan is a 51 y.o. male.   HPI  He is here for followup of his complicated sigmoid diverticulitis. He has been doing well and has been staying on a low fiber diet. I have reviewed his barium enema and it demonstrates significant diverticular disease of the sigmoid and distal descending colon.  Past Medical History  Diagnosis Date  . Diverticulitis of colon with perforation   . Sinusitis     Past Surgical History  Procedure Date  . Inguinal hernia repair 20 years ago    bilateral  . Laparoscopy 06/18/2011    Procedure: LAPAROSCOPY DIAGNOSTIC;  Surgeon: Adolph Pollack, MD;  Location: [facility];  Service: General;  Laterality: N/A;  Laparoscopic irrigation of abdominal cavity with appendectomy  . Vasectomy 2001  . Appendectomy   . Diverticulium surgery 06/19/2011  . Rhinoplasty     History reviewed. No pertinent family history.  Social History History  Substance Use Topics  . Smoking status: Never Smoker   . Smokeless tobacco: Never Used  . Alcohol Use: Yes     social    No Known Allergies  No current outpatient prescriptions on file.    Review of Systems Review of Systems  Constitutional: Negative for fever and chills.  Gastrointestinal: Negative for abdominal pain.    Blood pressure 134/84, pulse 72, temperature 98.4 F (36.9 C), temperature source Temporal, height 6\' 2"  (1.88 m), weight 237 lb 6.4 oz (107.684 kg), SpO2 98.00%.  Physical Exam Physical Exam  Constitutional: He appears well-developed and well-nourished. No distress.  HENT:  Head: Normocephalic and atraumatic.  Eyes: No scleral icterus.  Cardiovascular: Normal rate and regular rhythm.   Pulmonary/Chest: Effort normal and breath sounds normal.  Abdominal: Soft. He exhibits no distension and no mass. There is no tenderness.    Musculoskeletal: He exhibits no edema.    Data Reviewed Previous notes and BE.  Assessment    Complicated sigmoid diverticulitis    Plan    Laparoscopic-assisted partial colectomy.  I have explained the procedure and risks of colon resection.  Risks include but are not limited to bleeding, infection, wound problems, anesthesia, anastomotic leak, need for colostomy, injury to intraabominal organs (such as intestine, spleen, kidney, bladder, ureter, etc.), ileus, irregular bowel habits.  He seems to understand and agrees to proceed.       Satcha Storlie J 09/20/2011, 11:21 AM

## 2011-10-10 ENCOUNTER — Emergency Department (HOSPITAL_COMMUNITY): Payer: BC Managed Care – PPO

## 2011-10-10 ENCOUNTER — Telehealth (INDEPENDENT_AMBULATORY_CARE_PROVIDER_SITE_OTHER): Payer: Self-pay | Admitting: General Surgery

## 2011-10-10 ENCOUNTER — Encounter (HOSPITAL_COMMUNITY): Payer: Self-pay | Admitting: Emergency Medicine

## 2011-10-10 ENCOUNTER — Inpatient Hospital Stay (HOSPITAL_COMMUNITY)
Admission: EM | Admit: 2011-10-10 | Discharge: 2011-10-16 | DRG: 183 | Disposition: A | Discharge status: Home / Self Care | Payer: BC Managed Care – PPO | Attending: General Surgery | Admitting: General Surgery

## 2011-10-10 DIAGNOSIS — K5720 Diverticulitis of large intestine with perforation and abscess without bleeding: Secondary | ICD-10-CM | POA: Diagnosis present

## 2011-10-10 DIAGNOSIS — E663 Overweight: Secondary | ICD-10-CM | POA: Diagnosis present

## 2011-10-10 DIAGNOSIS — K5732 Diverticulitis of large intestine without perforation or abscess without bleeding: Principal | ICD-10-CM | POA: Diagnosis present

## 2011-10-10 DIAGNOSIS — R109 Unspecified abdominal pain: Secondary | ICD-10-CM | POA: Diagnosis present

## 2011-10-10 DIAGNOSIS — K5780 Diverticulitis of intestine, part unspecified, with perforation and abscess without bleeding: Secondary | ICD-10-CM

## 2011-10-10 DIAGNOSIS — Z6830 Body mass index (BMI) 30.0-30.9, adult: Secondary | ICD-10-CM

## 2011-10-10 LAB — URINALYSIS, ROUTINE W REFLEX MICROSCOPIC
Bilirubin Urine: NEGATIVE
Specific Gravity, Urine: 1.007 (ref 1.005–1.030)
pH: 7 (ref 5.0–8.0)

## 2011-10-10 LAB — CBC
HCT: 44 % (ref 39.0–52.0)
Hemoglobin: 15.4 g/dL (ref 13.0–17.0)
MCHC: 35 g/dL (ref 30.0–36.0)
MCV: 89.2 fL (ref 78.0–100.0)
WBC: 16.5 10*3/uL — ABNORMAL HIGH (ref 4.0–10.5)

## 2011-10-10 LAB — BASIC METABOLIC PANEL
BUN: 7 mg/dL (ref 6–23)
Chloride: 96 mEq/L (ref 96–112)
Creatinine, Ser: 1.05 mg/dL (ref 0.50–1.35)
Glucose, Bld: 114 mg/dL — ABNORMAL HIGH (ref 70–99)
Potassium: 4.1 mEq/L (ref 3.5–5.1)

## 2011-10-10 MED ORDER — SODIUM CHLORIDE 0.9 % IV SOLN
INTRAVENOUS | Status: DC
  Administered 2011-10-10: 22:00:00 via INTRAVENOUS

## 2011-10-10 MED ORDER — IOHEXOL 300 MG/ML  SOLN
100.0000 mL | Freq: Once | INTRAMUSCULAR | Status: AC | PRN
  Administered 2011-10-10: 100 mL via INTRAVENOUS

## 2011-10-10 MED ORDER — ONDANSETRON HCL 4 MG/2ML IJ SOLN
4.0000 mg | Freq: Once | INTRAMUSCULAR | Status: AC
  Administered 2011-10-10: 4 mg via INTRAVENOUS
  Filled 2011-10-10: qty 2

## 2011-10-10 MED ORDER — SODIUM CHLORIDE 0.9 % IV BOLUS (SEPSIS)
1000.0000 mL | Freq: Once | INTRAVENOUS | Status: AC
  Administered 2011-10-10: 1000 mL via INTRAVENOUS

## 2011-10-10 MED ORDER — HYDROMORPHONE HCL PF 1 MG/ML IJ SOLN
1.0000 mg | Freq: Once | INTRAMUSCULAR | Status: AC
  Administered 2011-10-10: 1 mg via INTRAVENOUS
  Filled 2011-10-10: qty 1

## 2011-10-10 MED ORDER — PIPERACILLIN-TAZOBACTAM 3.375 G IVPB
3.3750 g | Freq: Once | INTRAVENOUS | Status: AC
  Administered 2011-10-10: 3.375 g via INTRAVENOUS
  Filled 2011-10-10: qty 50

## 2011-10-10 NOTE — H&P (Signed)
Re:   Paul Sloan DOB:   [birth year] MRN:   [id number]  ASSESSMENT AND PLAN: 1.  Diverticular perforation, recurrent.  Localized to RLQ inflammation on CT scan.  His is scheduled for colon surgery 11/07/2011 by Dr. Elveria Rising.  Will admit, place on IV antibiotics (he was on Invanz before and sent home on Flagyl and Cipro), and keep NPO.  To recheck CBC in AM.   Chief Complaint  Patient presents with  . Abdominal Pain   REFERRING PHYSICIAN: Bruce Donath, M.D., Janyce Llanos.  HISTORY OF PRESENT ILLNESS: Paul Sloan is a 51 y.o. (DOB: 11-05-1960)  white male whose primary care physician is Lillia Mountain, MD and comes to [facility] for recurrent diverticulitis.  Originally had perf diverticulitis and periappendicitis operated on by Dr. Abbey Chatters on 06/18/2011.  Then hospitalized from 07/18/2011- 07/21/2011 with diverticular abscess. He had a CT guided abscess drainage on 07/19/2011.  He is set up for sigmoid colectomy 9/24 /2013 by Dr. Abbey Chatters for sigmoid colectomy.  But over the last couple of days has had increasing lower abdominal and mid abdominal pain.  He contacted our office today and was advised to go to the [facility].  I was consulted by Dr. Deanna Artis when the CT scan showed sigmoid diverticulitis with associated localized perforation.     Past Medical History  Diagnosis Date  . Diverticulitis of colon with perforation   . Sinusitis     Past Surgical History  Procedure Date  . Inguinal hernia repair 20 years ago    bilateral  . Laparoscopy 06/18/2011    Procedure: LAPAROSCOPY DIAGNOSTIC;  Surgeon: Adolph Pollack, MD;  Location: [facility];  Service: General;  Laterality: N/A;  Laparoscopic irrigation of abdominal cavity with appendectomy  . Vasectomy 2001  . Appendectomy   . Diverticulium surgery 06/19/2011  . Rhinoplasty      Current Facility-Administered Medications  Medication Dose Route Frequency Provider Last Rate Last Dose  . 0.9 %  sodium chloride infusion    Intravenous Continuous Toy Baker, MD 125 mL/hr at 10/10/11 2226    . HYDROmorphone (DILAUDID) injection 1 mg  1 mg Intravenous Once Toy Baker, MD   1 mg at 10/10/11 2017  . iohexol (OMNIPAQUE) 300 MG/ML solution 100 mL  100 mL Intravenous Once PRN Medication Radiologist, MD   100 mL at 10/10/11 2112  . ondansetron (ZOFRAN) injection 4 mg  4 mg Intravenous Once Toy Baker, MD   4 mg at 10/10/11 2017  . piperacillin-tazobactam (ZOSYN) IVPB 3.375 g  3.375 g Intravenous Once Toy Baker, MD   3.375 g at 10/10/11 2229  . sodium chloride 0.9 % bolus 1,000 mL  1,000 mL Intravenous Once Toy Baker, MD   1,000 mL at 10/10/11 2017   Current Outpatient Prescriptions  Medication Sig Dispense Refill  . ibuprofen (ADVIL,MOTRIN) 200 MG tablet Take 200 mg by mouth every 8 (eight) hours as needed. For pain.         No Known Allergies  REVIEW OF SYSTEMS: Skin:  No history of rash.  No history of abnormal moles. Infection:  No history of hepatitis or HIV.  No history of MRSA. Neurologic:  No history of stroke.  No history of seizure.  No history of headaches. Cardiac:  No history of hypertension. No history of heart disease.  No history of prior cardiac catheterization.  No history of seeing a cardiologist. Pulmonary:  Does not smoke cigarettes.  No asthma or bronchitis.  No  OSA/CPAP.  Endocrine:  No diabetes. No thyroid disease. Gastrointestinal:  No history of stomach disease.  No history of liver disease.  No history of gall bladder disease.  No history of pancreas disease.  Colonoscopy by Dr. Lacretia Nicks. Dulce Sellar in 2012.  See HPI. Urologic:  No history of kidney stones.  No history of bladder infections. Musculoskeletal:  No history of joint or back disease. Hematologic:  No bleeding disorder.  No history of anemia.  Not anticoagulated. Psycho-social:  The patient is oriented.   The patient has no obvious psychologic or social impairment to understanding our conversation and  plan.  SOCIAL and FAMILY HISTORY: Married. Has 2 boys - 17 and 12. Works as Technical sales engineer. Just worked the [employer].  PHYSICAL EXAM: BP 129/77  Pulse 100  Temp 100 F (37.8 C) (Oral)  Resp 20  SpO2 98%  General: WN WM who is mildly overweight who is alert and generally healthy appearing.  HEENT: Normal. Pupils equal. Neck: Supple. No mass.  No thyroid mass. Lymph Nodes:  No supraclavicular or cervical nodes. Lungs: Clear to auscultation and symmetric breath sounds. Heart:  RRR. No murmur or rub.  Abdomen: Soft. No mass. Scars from lap appendectomy.  Tender RLQ and mid abdomen.  No hernia. Rectal: Not done. Extremities:  Good strength and ROM  in upper and lower extremities. Neurologic:  Grossly intact to motor and sensory function. Psychiatric: Has normal mood and affect. Behavior is normal.   DATA REVIEWED: WBC - 16,500 CT scan - sigmoid diverticulitis with localized perforation, no drainable abscess.  Ovidio Kin, MD,  [facility] [address] [place]  [address]   [place], [place]    [address] Phone:  [phone] FAX:  [phone]

## 2011-10-10 NOTE — Telephone Encounter (Signed)
Per Dr Abbey Chatters it sounds like he has recurrent diverticulitis.  He needs to go to [facility] ER and get evaluated.  They will need to do a ct and labs.  We will alert the surgeon on call.  I notified the patient.

## 2011-10-10 NOTE — ED Provider Notes (Signed)
History     CSN: [id number]  Arrival date & time 10/10/11  1543   First MD Initiated Contact with Patient 10/10/11 1944      Chief Complaint  Patient presents with  . Abdominal Pain    (Consider location/radiation/quality/duration/timing/severity/associated sxs/prior treatment) Patient is a 51 y.o. male presenting with abdominal pain. The history is provided by the patient.  Abdominal Pain The primary symptoms of the illness include abdominal pain.   patient here with right lower quadrant pain since yesterday associated with fever nausea and vomiting. No bloody stools. History of intra-abdominal abscess in the past from a ruptured diverticuli. Denies any urinary symptoms. Pain is worse with movement characterized as being sharp and better with nothing. Called his surgeon today and told to come here for evaluation including abdominal CAT scan  Past Medical History  Diagnosis Date  . Diverticulitis of colon with perforation   . Sinusitis     Past Surgical History  Procedure Date  . Inguinal hernia repair 20 years ago    bilateral  . Laparoscopy 06/18/2011    Procedure: LAPAROSCOPY DIAGNOSTIC;  Surgeon: Adolph Pollack, MD;  Location: [facility];  Service: General;  Laterality: N/A;  Laparoscopic irrigation of abdominal cavity with appendectomy  . Vasectomy 2001  . Appendectomy   . Diverticulium surgery 06/19/2011  . Rhinoplasty     No family history on file.  History  Substance Use Topics  . Smoking status: Never Smoker   . Smokeless tobacco: Never Used  . Alcohol Use: Yes     social      Review of Systems  Gastrointestinal: Positive for abdominal pain.  All other systems reviewed and are negative.    Allergies  Review of patient's allergies indicates no known allergies.  Home Medications   Current Outpatient Rx  Name Route Sig Dispense Refill  . IBUPROFEN 200 MG PO TABS Oral Take 200 mg by mouth every 8 (eight) hours as needed. For pain.      BP 114/81   Pulse 100  Temp 99.1 F (37.3 C) (Oral)  Resp 20  SpO2 99%  Physical Exam  Nursing note and vitals reviewed. Constitutional: He is oriented to person, place, and time. Vital signs are normal. He appears well-developed and well-nourished.  Non-toxic appearance. No distress.  HENT:  Head: Normocephalic and atraumatic.  Eyes: Conjunctivae, EOM and lids are normal. Pupils are equal, round, and reactive to light.  Neck: Normal range of motion. Neck supple. No tracheal deviation present. No mass present.  Cardiovascular: Normal rate, regular rhythm and normal heart sounds.  Exam reveals no gallop.   No murmur heard. Pulmonary/Chest: Effort normal and breath sounds normal. No stridor. No respiratory distress. He has no decreased breath sounds. He has no wheezes. He has no rhonchi. He has no rales.  Abdominal: Soft. Normal appearance and bowel sounds are normal. He exhibits no distension. There is tenderness in the right lower quadrant. There is no rigidity, no rebound, no guarding and no CVA tenderness.  Musculoskeletal: Normal range of motion. He exhibits no edema and no tenderness.  Neurological: He is alert and oriented to person, place, and time. He has normal strength. No cranial nerve deficit or sensory deficit. GCS eye subscore is 4. GCS verbal subscore is 5. GCS motor subscore is 6.  Skin: Skin is warm and dry. No abrasion and no rash noted.  Psychiatric: He has a normal mood and affect. His speech is normal and behavior is normal.    ED  Course  Procedures (including critical care time)  Labs Reviewed  CBC - Abnormal; Notable for the following:    WBC 16.5 (*)     All other components within normal limits  BASIC METABOLIC PANEL - Abnormal; Notable for the following:    Sodium 133 (*)     Glucose, Bld 114 (*)     GFR calc non Af Amer 79 (*)     All other components within normal limits  URINALYSIS, ROUTINE W REFLEX MICROSCOPIC - Abnormal; Notable for the following:    Hgb urine  dipstick TRACE (*)     All other components within normal limits  URINE MICROSCOPIC-ADD ON   No results found.   No diagnosis found.    MDM  Patient started on Zosyn for his perforated diverticulitis. Spoke with Dr. Ezzard Standing and he will admit the patient        Toy Baker, MD 10/10/11 2152

## 2011-10-10 NOTE — ED Notes (Signed)
Pt c/o hx of diverticulitis for several months. Pt c/o pain to RLQ and radiates to mid abdomen. Pt's PMD told him to come to ED for a Abd CT scan. Pt states he had some n/v yesterday, but is better today. Denies diarrhea. Pt states he had BM this morning.

## 2011-10-10 NOTE — Telephone Encounter (Signed)
Pt called in complaining of abd pain on the right side.  He is a diverticulitis pt and is scheduled for surgery on 9/24.  He says the pain is sharp on the right side and progresses when he is moving around doing things.  He is still having bowel movements, he vomited last night and had a temp of 100.5.  Please advise.Marland KitchenMarland Kitchen

## 2011-10-11 ENCOUNTER — Encounter (HOSPITAL_COMMUNITY): Payer: Self-pay | Admitting: *Deleted

## 2011-10-11 LAB — CBC
HCT: 42.2 % (ref 39.0–52.0)
Hemoglobin: 14.4 g/dL (ref 13.0–17.0)
RBC: 4.77 MIL/uL (ref 4.22–5.81)
WBC: 16.5 10*3/uL — ABNORMAL HIGH (ref 4.0–10.5)

## 2011-10-11 MED ORDER — HEPARIN SODIUM (PORCINE) 5000 UNIT/ML IJ SOLN
5000.0000 [IU] | Freq: Three times a day (TID) | INTRAMUSCULAR | Status: DC
  Administered 2011-10-11 – 2011-10-16 (×16): 5000 [IU] via SUBCUTANEOUS
  Filled 2011-10-11 (×19): qty 1

## 2011-10-11 MED ORDER — SODIUM CHLORIDE 0.9 % IV SOLN
1.0000 g | INTRAVENOUS | Status: DC
  Administered 2011-10-11 – 2011-10-15 (×5): 1 g via INTRAVENOUS
  Filled 2011-10-11 (×6): qty 1

## 2011-10-11 MED ORDER — KCL IN DEXTROSE-NACL 20-5-0.45 MEQ/L-%-% IV SOLN
INTRAVENOUS | Status: DC
  Administered 2011-10-11 – 2011-10-12 (×4): via INTRAVENOUS
  Filled 2011-10-11 (×7): qty 1000

## 2011-10-11 MED ORDER — ONDANSETRON HCL 4 MG/2ML IJ SOLN: 4.0000 mg | Freq: Four times a day (QID) | INTRAMUSCULAR | Status: DC | PRN

## 2011-10-11 MED ORDER — MORPHINE SULFATE 2 MG/ML IJ SOLN
1.0000 mg | INTRAMUSCULAR | Status: DC | PRN
  Administered 2011-10-11: 2 mg via INTRAVENOUS
  Administered 2011-10-11: 4 mg via INTRAVENOUS
  Administered 2011-10-11: 2 mg via INTRAVENOUS
  Administered 2011-10-11 (×2): 4 mg via INTRAVENOUS
  Filled 2011-10-11: qty 2
  Filled 2011-10-11: qty 1
  Filled 2011-10-11 (×2): qty 2
  Filled 2011-10-11: qty 1
  Filled 2011-10-11: qty 2

## 2011-10-11 MED ORDER — PIPERACILLIN-TAZOBACTAM 3.375 G IVPB
3.3750 g | Freq: Three times a day (TID) | INTRAVENOUS | Status: DC
  Administered 2011-10-11: 3.375 g via INTRAVENOUS
  Filled 2011-10-11 (×2): qty 50

## 2011-10-11 MED ORDER — BIOTENE DRY MOUTH MT LIQD
15.0000 mL | Freq: Two times a day (BID) | OROMUCOSAL | Status: DC
  Administered 2011-10-11 – 2011-10-15 (×8): 15 mL via OROMUCOSAL

## 2011-10-11 MED ORDER — CHLORHEXIDINE GLUCONATE 0.12 % MT SOLN
15.0000 mL | Freq: Two times a day (BID) | OROMUCOSAL | Status: DC
  Administered 2011-10-13 – 2011-10-15 (×5): 15 mL via OROMUCOSAL
  Filled 2011-10-11 (×13): qty 15

## 2011-10-11 NOTE — Progress Notes (Signed)
Subjective: Doesn't feel much different than yesterday.  Sore RLQ and around his umbilicus.  Objective: Vital signs in last 24 hours: Temp:  [99 F (37.2 C)-100 F (37.8 C)] 99 F (37.2 C) (08/28 0454) Pulse Rate:  [85-100] 85  (08/28 0454) Resp:  [18-20] 18  (08/28 0454) BP: (114-129)/(69-81) 119/69 mmHg (08/28 0454) SpO2:  [96 %-100 %] 96 % (08/28 0454) Weight:  [107.911 kg (237 lb 14.4 oz)] 107.911 kg (237 lb 14.4 oz) (08/28 0040) Last BM Date: 10/10/11  Low grade temp 100, most of the night. No change in WBC, no drainable abscess at this point on CT.  Intake/Output from previous day: 08/27 0701 - 08/28 0700 In: 472 [I.V.:472] Out: 700 [Urine:700] Intake/Output this shift:    General appearance: alert, cooperative and no distress GI: soft, tender RLQ and around right side of umbilicus, few BS, no flatus.  Lab Results:   Basename 10/11/11 0330 10/10/11 1700  WBC 16.5* 16.5*  HGB 14.4 15.4  HCT 42.2 44.0  PLT 248 263    BMET  Basename 10/10/11 1700  NA 133*  K 4.1  CL 96  CO2 29  GLUCOSE 114*  BUN 7  CREATININE 1.05  CALCIUM 10.1   PT/INR No results found for this basename: LABPROT:2,INR:2 in the last 72 hours  No results found for this basename: AST:5,ALT:5,ALKPHOS:5,BILITOT:5,PROT:5,ALBUMIN:5 in the last 168 hours   Lipase  No results found for this basename: lipase     Studies/Results: Ct Abdomen Pelvis W Contrast  10/10/2011  *RADIOLOGY REPORT*  Clinical Data: Right lower quadrant abdominal pain  CT ABDOMEN AND PELVIS WITH CONTRAST  Technique:  Multidetector CT imaging of the abdomen and pelvis was performed following the standard protocol during bolus administration of intravenous contrast.  Contrast: OMNIPAQUE IOHEXOL 300 MG/ML  SOLN  Comparison: 07/18/2011  Findings: Lung bases are clear.  Liver, spleen, pancreas, and adrenal glands are within normal limits.  Cholelithiasis, without associated inflammatory changes.  No intrahepatic or  extrahepatic ductal dilatation.  Kidneys are notable for excretory contrast.  1.3 cm right upper pole cyst (series 2/image 23).  No hydronephrosis.  No evidence of bowel obstruction.  Prior appendectomy.  Inflammatory changes with localized perforation involving the sigmoid colon (series 2/image 68), compatible with diverticulitis. Foci of gas tracks towards an adjacent loop of small bowel (likely terminal ileum) without definite fistulous communication. Surrounding mesenteric inflammatory changes.  No drainable fluid collection/abscess at the current time.  Reactive mesenteric/ileocolic lymph nodes measuring up to 9 mm short axis.  Atherosclerotic calcifications of the abdominal aorta and branch vessels.  Prostatomegaly, measuring up to 5.2 cm and there is dimension, with enlargement of the central gland.  Bladder is within normal limits.  Mild degenerative changes of the visualized thoracolumbar spine.  IMPRESSION: Sigmoid diverticulitis with associated localized perforation.  Surrounding mesenteric stranding/inflammatory changes with secondary involvement of an adjacent loop of small bowel (likely terminal ileum), without definite fistulous communication.  No drainable fluid collection or abscess at the current time.  These results were called by telephone on 10/10/2011 at 2135 hours to Dr. Bruce Donath, who verbally acknowledged these results.   Original Report Authenticated By: Charline Bills, M.D.     Medications:    . antiseptic oral rinse  15 mL Mouth Rinse q12n4p  . chlorhexidine  15 mL Mouth Rinse BID  . heparin  5,000 Units Subcutaneous Q8H  . HYDROmorphone  1 mg Intravenous Once  . ondansetron  4 mg Intravenous Once  . piperacillin-tazobactam (ZOSYN)  IV  3.375 g Intravenous Once  . piperacillin-tazobactam (ZOSYN)  IV  3.375 g Intravenous Q8H  . sodium chloride  1,000 mL Intravenous Once    Assessment/Plan Diverticular perforation, recurrent, scheduled for colon surgery 11/07/2011 by Dr.  Elveria Rising.Laparoscopic exploration and irrigation of the abdominal cavity with appendectomy and placement of drains 06/18/11.  Recurrent sigmoid diverticulitis with pelvic abscess now 6/4-07/21/2011.   Plan:  Antibiotics, and bowel rest. Will let Dr. Abbey Chatters know about him.      LOS: 1 day    Paul Sloan 10/11/2011

## 2011-10-11 NOTE — Progress Notes (Signed)
Patient seen and examined.  He feels about the same this morning.  Has some RLQ tenderness and guarding.  No abscess on CT.  Will switch him to [facility] as this is what he has needed in the past.  Ideally, he will respond to the antibiotics and we will be able to perform elective colectomy as schedule one month from now when the acute inflammatory changes have resolved.  If he does not respond to IV antibiotics, will need a partial colectomy and colostomy this admission.  He is aware of this.

## 2011-10-12 LAB — CBC
HCT: 43.8 % (ref 39.0–52.0)
Hemoglobin: 14.5 g/dL (ref 13.0–17.0)
MCH: 29.7 pg (ref 26.0–34.0)
MCV: 89.8 fL (ref 78.0–100.0)
RBC: 4.88 MIL/uL (ref 4.22–5.81)

## 2011-10-12 MED ORDER — KCL IN DEXTROSE-NACL 20-5-0.9 MEQ/L-%-% IV SOLN
INTRAVENOUS | Status: DC
  Administered 2011-10-12 – 2011-10-13 (×2): via INTRAVENOUS
  Administered 2011-10-13: 1000 mL via INTRAVENOUS
  Administered 2011-10-14 – 2011-10-15 (×3): via INTRAVENOUS
  Filled 2011-10-12 (×8): qty 1000

## 2011-10-12 NOTE — Progress Notes (Signed)
Subjective: Feels a little better.  Passing gas.  Objective: Vital signs in last 24 hours: Temp:  [97.5 F (36.4 C)-99.4 F (37.4 C)] 98.4 F (36.9 C) (08/29 0610) Pulse Rate:  [79-88] 79  (08/29 0610) Resp:  [16-18] 16  (08/29 0610) BP: (118-122)/(72-77) 121/75 mmHg (08/29 0610) SpO2:  [99 %-100 %] 99 % (08/29 0610) Last BM Date: 10/10/11  Intake/Output from previous day: 08/28 0701 - 08/29 0700 In: 2884 [I.V.:2884] Out: 4050 [Urine:4050] Intake/Output this shift:    PE: Abd-soft, still with some RLQ tenderness and mild guarding  Lab Results:   Basename 10/12/11 0405 10/11/11 0330  WBC 12.5* 16.5*  HGB 14.5 14.4  HCT 43.8 42.2  PLT 204 248   BMET  Basename 10/10/11 1700  NA 133*  K 4.1  CL 96  CO2 29  GLUCOSE 114*  BUN 7  CREATININE 1.05  CALCIUM 10.1   PT/INR No results found for this basename: LABPROT:2,INR:2 in the last 72 hours Comprehensive Metabolic Panel:    Component Value Date/Time   NA 133* 10/10/2011 1700   K 4.1 10/10/2011 1700   CL 96 10/10/2011 1700   CO2 29 10/10/2011 1700   BUN 7 10/10/2011 1700   CREATININE 1.05 10/10/2011 1700   GLUCOSE 114* 10/10/2011 1700   CALCIUM 10.1 10/10/2011 1700   AST 22 07/21/2011 0345   ALT 36 07/21/2011 0345   ALKPHOS 91 07/21/2011 0345   BILITOT 0.3 07/21/2011 0345   PROT 6.9 07/21/2011 0345   ALBUMIN 3.0* 07/21/2011 0345     Studies/Results: Ct Abdomen Pelvis W Contrast  10/10/2011  *RADIOLOGY REPORT*  Clinical Data: Right lower quadrant abdominal pain  CT ABDOMEN AND PELVIS WITH CONTRAST  Technique:  Multidetector CT imaging of the abdomen and pelvis was performed following the standard protocol during bolus administration of intravenous contrast.  Contrast: OMNIPAQUE IOHEXOL 300 MG/ML  SOLN  Comparison: 07/18/2011  Findings: Lung bases are clear.  Liver, spleen, pancreas, and adrenal glands are within normal limits.  Cholelithiasis, without associated inflammatory changes.  No intrahepatic or extrahepatic  ductal dilatation.  Kidneys are notable for excretory contrast.  1.3 cm right upper pole cyst (series 2/image 23).  No hydronephrosis.  No evidence of bowel obstruction.  Prior appendectomy.  Inflammatory changes with localized perforation involving the sigmoid colon (series 2/image 68), compatible with diverticulitis. Foci of gas tracks towards an adjacent loop of small bowel (likely terminal ileum) without definite fistulous communication. Surrounding mesenteric inflammatory changes.  No drainable fluid collection/abscess at the current time.  Reactive mesenteric/ileocolic lymph nodes measuring up to 9 mm short axis.  Atherosclerotic calcifications of the abdominal aorta and branch vessels.  Prostatomegaly, measuring up to 5.2 cm and there is dimension, with enlargement of the central gland.  Bladder is within normal limits.  Mild degenerative changes of the visualized thoracolumbar spine.  IMPRESSION: Sigmoid diverticulitis with associated localized perforation.  Surrounding mesenteric stranding/inflammatory changes with secondary involvement of an adjacent loop of small bowel (likely terminal ileum), without definite fistulous communication.  No drainable fluid collection or abscess at the current time.  These results were called by telephone on 10/10/2011 at 2135 hours to Dr. Bruce Donath, who verbally acknowledged these results.   Original Report Authenticated By: Charline Bills, M.D.     Anti-infectives: Anti-infectives     Start     Dose/Rate Route Frequency Ordered Stop   10/11/11 1200   ertapenem (INVANZ) 1 g in sodium chloride 0.9 % 50 mL IVPB  1 g 100 mL/hr over 30 Minutes Intravenous Every 24 hours 10/11/11 0840     10/11/11 0600   piperacillin-tazobactam (ZOSYN) IVPB 3.375 g  Status:  Discontinued        3.375 g 12.5 mL/hr over 240 Minutes Intravenous 3 times per day 10/11/11 0038 10/11/11 0840   10/10/11 2230   piperacillin-tazobactam (ZOSYN) IVPB 3.375 g        3.375 g 12.5  mL/hr over 240 Minutes Intravenous  Once 10/10/11 2152 10/11/11 0229          Assessment Principal Problem:  *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   LOS: 2 days   Plan: Continue bowel rest and abxs.  Repeat CBC in am.  Nicklas Mcsweeney J 10/12/2011

## 2011-10-12 NOTE — Progress Notes (Signed)
UR done. 

## 2011-10-13 LAB — CBC
HCT: 41 % (ref 39.0–52.0)
Hemoglobin: 13.8 g/dL (ref 13.0–17.0)
MCHC: 33.7 g/dL (ref 30.0–36.0)
MCV: 89.5 fL (ref 78.0–100.0)
RDW: 13.2 % (ref 11.5–15.5)

## 2011-10-13 MED ORDER — AMOXICILLIN-POT CLAVULANATE 875-125 MG PO TABS: 1.0000 | ORAL_TABLET | Freq: Two times a day (BID) | ORAL | Status: AC

## 2011-10-13 NOTE — Progress Notes (Signed)
Patient resting in bed, no signs of respiratory or cardiac distress. Patient is free from injury, room intact and cluttered free.  Medications have been tolerated during the course of the shift.  He has denied pain medication thus far.  Will continue to monitor patient during the remainder of the shift. Call bell is in reach. Side rails are in an upright position.

## 2011-10-13 NOTE — Progress Notes (Signed)
  Subjective: Has less pain this AM (2 out of 10).  Passing gas.  No BM.  Objective: Vital signs in last 24 hours: Temp:  [98.2 F (36.8 C)-98.5 F (36.9 C)] 98.2 F (36.8 C) (08/30 0507) Pulse Rate:  [72-88] 88  (08/30 0507) Resp:  [18] 18  (08/30 0507) BP: (117-129)/(76-80) 117/80 mmHg (08/30 0507) SpO2:  [97 %-100 %] 97 % (08/30 0507) Last BM Date: 10/10/11  Intake/Output from previous day: 08/29 0701 - 08/30 0700 In: 1670 [I.V.:1670] Out: 3500 [Urine:3500] Intake/Output this shift:    PE: Abd-soft, less RLQ tenderness and no guarding  Lab Results:   Basename 10/13/11 0354 10/12/11 0405  WBC 8.1 12.5*  HGB 13.8 14.5  HCT 41.0 43.8  PLT 278 204   BMET  Basename 10/10/11 1700  NA 133*  K 4.1  CL 96  CO2 29  GLUCOSE 114*  BUN 7  CREATININE 1.05  CALCIUM 10.1   PT/INR No results found for this basename: LABPROT:2,INR:2 in the last 72 hours Comprehensive Metabolic Panel:    Component Value Date/Time   NA 133* 10/10/2011 1700   K 4.1 10/10/2011 1700   CL 96 10/10/2011 1700   CO2 29 10/10/2011 1700   BUN 7 10/10/2011 1700   CREATININE 1.05 10/10/2011 1700   GLUCOSE 114* 10/10/2011 1700   CALCIUM 10.1 10/10/2011 1700   AST 22 07/21/2011 0345   ALT 36 07/21/2011 0345   ALKPHOS 91 07/21/2011 0345   BILITOT 0.3 07/21/2011 0345   PROT 6.9 07/21/2011 0345   ALBUMIN 3.0* 07/21/2011 0345     Studies/Results: No results found.  Anti-infectives: Anti-infectives     Start     Dose/Rate Route Frequency Ordered Stop   10/11/11 1200   ertapenem (INVANZ) 1 g in sodium chloride 0.9 % 50 mL IVPB        1 g 100 mL/hr over 30 Minutes Intravenous Every 24 hours 10/11/11 0840     10/11/11 0600   piperacillin-tazobactam (ZOSYN) IVPB 3.375 g  Status:  Discontinued        3.375 g 12.5 mL/hr over 240 Minutes Intravenous 3 times per day 10/11/11 0038 10/11/11 0840   10/10/11 2230  piperacillin-tazobactam (ZOSYN) IVPB 3.375 g       3.375 g 12.5 mL/hr over 240 Minutes Intravenous   Once 10/10/11 2152 10/11/11 0229          Assessment Principal Problem:  *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   LOS: 3 days   Plan: Start clear liquids.  Repeat CT 8/31.  Continue InVanz.  Brisa Auth J 10/13/2011

## 2011-10-13 NOTE — Progress Notes (Signed)
Patient resting in bed, no signs of respiratory or cardiac distress.  Patient denies any pain or request of pain medicine.  He has tolerated his ambulating during the course of the shift.  Skin remains clean dry and intact.  No pertinent findings warranting attention.  IV fluids are infusing and intact. Will continue to monitor patient during the remainder of the shift.  Side rails are in an upright position and bed brakes are engaged.

## 2011-10-14 ENCOUNTER — Inpatient Hospital Stay (HOSPITAL_COMMUNITY): Payer: BC Managed Care – PPO

## 2011-10-14 LAB — CBC
HCT: 42.5 % (ref 39.0–52.0)
Hemoglobin: 14.3 g/dL (ref 13.0–17.0)
MCH: 30 pg (ref 26.0–34.0)
MCHC: 33.6 g/dL (ref 30.0–36.0)
MCV: 89.3 fL (ref 78.0–100.0)

## 2011-10-14 MED ORDER — IOHEXOL 300 MG/ML  SOLN
100.0000 mL | Freq: Once | INTRAMUSCULAR | Status: AC | PRN
  Administered 2011-10-14: 100 mL via INTRAVENOUS

## 2011-10-14 NOTE — Progress Notes (Signed)
Patient ID: Paul Sloan, male   DOB: April 18, 1960, 51 y.o.   MRN: [id number]  General Surgery - [facility] - Progress Note  Subjective: Patient back from CT scan this AM.  Tolerating clear liquid diet.  Mild pain RLQ.  Objective: Vital signs in last 24 hours: Temp:  [98 F (36.7 C)-98.5 F (36.9 C)] 98.1 F (36.7 C) (08/31 0620) Pulse Rate:  [75-92] 80  (08/31 0620) Resp:  [18] 18  (08/31 0620) BP: (112-128)/(70-83) 112/70 mmHg (08/31 0620) SpO2:  [99 %-100 %] 99 % (08/31 0620) Last BM Date: 10/10/11  Intake/Output from previous day: 08/30 0701 - 08/31 0700 In: 480 [P.O.:480] Out: 2700 [Urine:2700]  Exam: HEENT - clear, not icteric Neck - soft Chest - clear bilaterally Cor - RRR, no murmur Abd - mild distension; BS present; tender to palpation RLQ with guarding Ext - no significant edema Neuro - grossly intact, no focal deficits  Lab Results:   Basename 10/14/11 0519 10/13/11 0354  WBC 6.5 8.1  HGB 14.3 13.8  HCT 42.5 41.0  PLT 318 278    No results found for this basename: NA:2,K:2,CL:2,CO2:2,GLUCOSE:2,BUN:2,CREATININE:2,CALCIUM:2 in the last 72 hours  Studies/Results: Ct Abdomen Pelvis W Contrast  10/14/2011  *RADIOLOGY REPORT*  Clinical Data: Diverticulitis  CT ABDOMEN AND PELVIS WITH CONTRAST  Technique:  Multidetector CT imaging of the abdomen and pelvis was performed following the standard protocol during bolus administration of intravenous contrast.  Contrast: OMNIPAQUE IOHEXOL 300 MG/ML  SOLN  Comparison: 10/10/2011  Findings: Visualized lung bases clear.  Sub centimeter stone in the dependent aspect of the gallbladder fundus.  Unremarkable liver, spleen, adrenal glands, pancreas, kidneys, portal vein.  Minimal plaque in the infrarenal abdominal aorta without aneurysm.  Stomach is physiologically distended.  Small bowel nondilated.  Appendix not discretely identified.  Scattered colonic diverticula.  There is mild wall thickening  in the mid/distal sigmoid colon. There has been some increase in the scattered loculated extraluminal gas bubbles in the right lower quadrant adjacent to the sigmoid colon.  There is a small amount of fluid in the mesentery in this region as well, although no discrete drainable collection.  Regional inflammatory/edematous changes persist.  No new fluid collection.   There are a few enlarged right central mesenteric lymph nodes.  No ascites.  No free air.  No retroperitoneal or pelvic adenopathy. Urinary bladder incompletely distended.  Moderate prostatic enlargement.  No hydronephrosis.  Regional bones unremarkable.  IMPRESSION:  1.  Probable sigmoid diverticulitis with contained perforation. Some increase in the amount of extraluminal scattered gas bubbles but no discrete drainable abscess. 2.  Cholelithiasis   Original Report Authenticated By: Osa Craver, M.D.     Assessment / Plan: 1.  Acute diverticulitis with probable contained perforation  - IV Invanz  - clear liquid diet  - WBC now normal  - CT reviewed - small extraluminal air, no abscess  Velora Heckler, MD, [facility] Office: [phone]  10/14/2011

## 2011-10-15 MED ORDER — HYDROCODONE-ACETAMINOPHEN 5-325 MG PO TABS: 1.0000 | ORAL_TABLET | ORAL | Status: DC | PRN

## 2011-10-15 NOTE — Progress Notes (Signed)
Patient ID: Paul Sloan, male   DOB: 11-May-1960, 51 y.o.   MRN: [id number]  General Surgery - [facility] - Progress Note  Subjective: Patient feels better today.  Several loose BM's.  Decreased pain.  No nausea or emesis.  Objective: Vital signs in last 24 hours: Temp:  [98.6 F (37 C)-98.9 F (37.2 C)] 98.6 F (37 C) (09/01 0518) Pulse Rate:  [78-95] 78  (09/01 0518) Resp:  [16-20] 16  (09/01 0518) BP: (105-129)/(71-85) 105/71 mmHg (09/01 0518) SpO2:  [98 %-99 %] 98 % (09/01 0518) Last BM Date: 10/14/11  Intake/Output from previous day: 08/31 0701 - 09/01 0700 In: 1702 [P.O.:1080; I.V.:622] Out: 1800 [Urine:1800]  Exam: HEENT - clear, not icteric Neck - soft Chest - clear bilaterally Cor - RRR, no murmur Abd - softer, no distension; BS present; mild RLQ tenderness, no mass, no guarding Ext - no significant edema Neuro - grossly intact, no focal deficits  Lab Results:   Basename 10/14/11 0519 10/13/11 0354  WBC 6.5 8.1  HGB 14.3 13.8  HCT 42.5 41.0  PLT 318 278    No results found for this basename: NA:2,K:2,CL:2,CO2:2,GLUCOSE:2,BUN:2,CREATININE:2,CALCIUM:2 in the last 72 hours  Studies/Results: Ct Abdomen Pelvis W Contrast  10/14/2011  *RADIOLOGY REPORT*  Clinical Data: Diverticulitis  CT ABDOMEN AND PELVIS WITH CONTRAST  Technique:  Multidetector CT imaging of the abdomen and pelvis was performed following the standard protocol during bolus administration of intravenous contrast.  Contrast: OMNIPAQUE IOHEXOL 300 MG/ML  SOLN  Comparison: 10/10/2011  Findings: Visualized lung bases clear.  Sub centimeter stone in the dependent aspect of the gallbladder fundus.  Unremarkable liver, spleen, adrenal glands, pancreas, kidneys, portal vein.  Minimal plaque in the infrarenal abdominal aorta without aneurysm.  Stomach is physiologically distended.  Small bowel nondilated.  Appendix not discretely identified.  Scattered colonic diverticula.   There is mild wall thickening in the mid/distal sigmoid colon. There has been some increase in the scattered loculated extraluminal gas bubbles in the right lower quadrant adjacent to the sigmoid colon.  There is a small amount of fluid in the mesentery in this region as well, although no discrete drainable collection.  Regional inflammatory/edematous changes persist.  No new fluid collection.   There are a few enlarged right central mesenteric lymph nodes.  No ascites.  No free air.  No retroperitoneal or pelvic adenopathy. Urinary bladder incompletely distended.  Moderate prostatic enlargement.  No hydronephrosis.  Regional bones unremarkable.  IMPRESSION:  1.  Probable sigmoid diverticulitis with contained perforation. Some increase in the amount of extraluminal scattered gas bubbles but no discrete drainable abscess. 2.  Cholelithiasis   Original Report Authenticated By: Osa Craver, M.D.     Assessment / Plan: 1.  Acute diverticulitis  - IV Invanz  - advance to full liquid diet  - check CBC in AM 9/2  - possibly home tomorrow on po abx  Velora Heckler, MD, [facility] Office: [phone]  10/15/2011

## 2011-10-16 LAB — CBC
MCH: 30.3 pg (ref 26.0–34.0)
MCV: 89.6 fL (ref 78.0–100.0)
Platelets: 313 10*3/uL (ref 150–400)
RBC: 4.62 MIL/uL (ref 4.22–5.81)

## 2011-10-16 MED ORDER — HYDROCODONE-ACETAMINOPHEN 5-325 MG PO TABS: 1.0000 | ORAL_TABLET | ORAL | Status: AC | PRN

## 2011-10-16 NOTE — Progress Notes (Signed)
Pt discharged home via family; Pt and family given and explained all discharge instructions, carenotes, and prescriptions; pt and family stated understanding and denied questions/concerns; all f/u appointments in place; IV removed without complicaitons; pt stable at time of discharge  

## 2011-10-16 NOTE — Progress Notes (Signed)
Discharge summary sent to payer through [facility]  

## 2011-10-16 NOTE — Progress Notes (Signed)
Patient ID: Paul Sloan, male   DOB: 25-Dec-1960, 51 y.o.   MRN: [id number]  General Surgery - [facility] - Progress Note  Subjective: Patient without complaints.  Mild RLQ tenderness.  Having bowel movements.  Objective: Vital signs in last 24 hours: Temp:  [98 F (36.7 C)-98.6 F (37 C)] 98.4 F (36.9 C) (09/02 0600) Pulse Rate:  [72-85] 74  (09/02 0600) Resp:  [18-20] 20  (09/02 0600) BP: (104-124)/(65-83) 104/65 mmHg (09/02 0600) SpO2:  [96 %-99 %] 99 % (09/02 0600) Last BM Date: 10/15/11  Intake/Output from previous day: 09/01 0701 - 09/02 0700 In: 1440.5 [P.O.:1200; I.V.:190.5; IV Piggyback:50] Out: 2250 [Urine:2250]  Exam: HEENT - clear, not icteric Neck - soft Chest -  clear bilaterally Cor - RRR, no murmur Abd - soft without distension; BS present; minimal tenderness RLQ Ext - no significant edema Neuro - grossly intact, no focal deficits  Lab Results:   Basename 10/16/11 0354 10/14/11 0519  WBC 9.3 6.5  HGB 14.0 14.3  HCT 41.4 42.5  PLT 313 318    No results found for this basename: NA:2,K:2,CL:2,CO2:2,GLUCOSE:2,BUN:2,CREATININE:2,CALCIUM:2 in the last 72 hours  Studies/Results: No results found.  Assessment / Plan: 1.  Acute diverticulitis  - change to oral abx  - surgery planned later this month  - discharge home today  Velora Heckler, MD, [facility] Office: [phone]  10/16/2011

## 2011-10-30 NOTE — Discharge Summary (Signed)
Physician Discharge Summary  Patient ID: Paul Sloan MRN: [id number] DOB/AGE: [birth year] 51 y.o.  Admit date: 10/10/2011 Discharge date: 10/16/2011  Admission Diagnoses:  Recurrent sigmoid diverticulitis  Discharge Diagnoses: Recurrent sigmoid diverticulitis   *   Discharged Condition: Improved  Hospital Course:  He originally had perf diverticulitis and periappendicitis operated  on 06/18/2011. Then hospitalized from 07/18/2011- 07/21/2011 with diverticular abscess. He had a CT guided abscess drainage on 07/19/2011. He is set up for sigmoid colectomy 9/24 /2013  for sigmoid colectomy.  But over the last couple of days has had increasing lower abdominal and mid abdominal pain.  He subsequently admitted and placed on intravenous antibiotics and bowel rest. CT scan demonstrated recurrence of the diverticulitis with a small amount of extraluminal air. He improved clinically over his hospital stay and a CT was not significantly different maybe demonstrating a slightly large amount of extraluminal air but no abscess. He subsequently was discharged on oral antibiotics and doing well.  He will remain on antibiotics until the time of his surgery.   Consults: None  Significant Diagnostic Studies: radiology: CT scan: Abdomen and pelvis  Treatments: antibiotics  Discharge Exam: Blood pressure 104/65, pulse 74, temperature 98.4 F (36.9 C), temperature source Oral, resp. rate 20, height 6\' 2"  (1.88 m), weight 237 lb 14.4 oz (107.911 kg), SpO2 99.00%.   Disposition: 01-Home or Self Care  Discharge Orders    Future Appointments: Provider: Department: Dept Phone: Center:   11/02/2011 2:15 PM [facility] [phone] None     Future Orders Please Complete By Expires   Diet - low sodium heart healthy      Increase activity slowly      No wound care          Medication List     As of 10/30/2011  3:17 PM    TAKE these medications         ibuprofen 200 MG tablet     Commonly known as: ADVIL,MOTRIN   Take 200 mg by mouth every 8 (eight) hours as needed. For pain.           Follow-up Information    Call Mayu Ronk J, MD. (Review plans for surgery and obtain prep instructions)    Contact information:   [address] [place] [address] [phone]          Signed: Adolph Pollack 10/30/2011, 3:17 PM

## 2011-10-31 ENCOUNTER — Encounter (HOSPITAL_COMMUNITY): Payer: Self-pay | Admitting: Pharmacy Technician

## 2011-11-02 ENCOUNTER — Encounter (HOSPITAL_COMMUNITY)
Admission: RE | Admit: 2011-11-02 | Discharge: 2011-11-02 | Disposition: A | Discharge status: Home / Self Care | Payer: BC Managed Care – PPO | Source: Ambulatory Visit | Attending: General Surgery | Admitting: General Surgery

## 2011-11-02 ENCOUNTER — Encounter (HOSPITAL_COMMUNITY): Payer: Self-pay

## 2011-11-02 LAB — CBC WITH DIFFERENTIAL/PLATELET
Eosinophils Absolute: 0.2 10*3/uL (ref 0.0–0.7)
Eosinophils Relative: 2 % (ref 0–5)
Lymphs Abs: 1.9 10*3/uL (ref 0.7–4.0)
MCH: 29.8 pg (ref 26.0–34.0)
MCV: 89.6 fL (ref 78.0–100.0)
Monocytes Relative: 6 % (ref 3–12)
Platelets: 330 10*3/uL (ref 150–400)
RBC: 5.3 MIL/uL (ref 4.22–5.81)

## 2011-11-02 LAB — PROTIME-INR
INR: 0.92 (ref 0.00–1.49)
Prothrombin Time: 12.3 seconds (ref 11.6–15.2)

## 2011-11-02 LAB — COMPREHENSIVE METABOLIC PANEL
BUN: 6 mg/dL (ref 6–23)
Calcium: 9.8 mg/dL (ref 8.4–10.5)
GFR calc Af Amer: >90 mL/min (ref >90)
Glucose, Bld: 95 mg/dL (ref 70–99)
Sodium: 136 mEq/L (ref 135–145)
Total Protein: 7.3 g/dL (ref 6.0–8.3)

## 2011-11-02 NOTE — Patient Instructions (Signed)
20      Your procedure is scheduled on: Tuesday 11/07/2011 at 1000am   Report to [facility] at  0730 AM.  Call this number if you have problems the morning of surgery: [phone]   Remember:FOLLOW BOWEL PREP INSTRUCTIONS FROM DR. ROSENBOWER'S OFFICE THE DAY BEFORE SURGERY AND FOLLOW A CLEAR LIQUID DIET THE DAY BEFORE SURGERY.   Do not eat food or drink liquids after midnight Sunday 11/05/2011-BE ON CLEAR LIQUIDS Monday 11/06/2011  Take these medicines the morning of surgery with A SIP OF WATER: NONE   Do not bring valuables to the hospital.  .  Leave suitcase in the car. After surgery it may be brought to your room.  For patients admitted to the hospital, checkout time is 11:00 AM the day of              Discharge.    Special Instructions: See [facility] Preparing  For Surgery Instruction Sheet. Do not wear jewelry, lotions powders, perfumes. Women do not shave legs or underarms for 12 hours before showers. Contacts, partial plates, or dentures may not be worn into surgery.                          Patients discharged the day of surgery will not be allowed to drive home.  If going home the same day of surgery, must have someone stay with you first 24 hrs.at home and arrange for someone to drive you home from the              Hospital.   Please read over the following fact sheets that you were given: MRSA              INFORMATION, Sleep apnea sheet, Incentive spirometry sheet, Blood transfusion sheet               Telford Nab.Deni Lefever,RN,BSN [phone]

## 2011-11-07 ENCOUNTER — Encounter (HOSPITAL_COMMUNITY): Payer: Self-pay

## 2011-11-07 ENCOUNTER — Encounter (HOSPITAL_COMMUNITY): Payer: Self-pay | Admitting: Anesthesiology

## 2011-11-07 ENCOUNTER — Encounter (HOSPITAL_COMMUNITY)
Admission: RE | Disposition: A | Discharge status: Home / Self Care | Payer: Self-pay | Source: Ambulatory Visit | Attending: General Surgery

## 2011-11-07 ENCOUNTER — Ambulatory Visit (HOSPITAL_COMMUNITY): Payer: BC Managed Care – PPO | Admitting: Anesthesiology

## 2011-11-07 ENCOUNTER — Inpatient Hospital Stay (HOSPITAL_COMMUNITY)
Admission: RE | Admit: 2011-11-07 | Discharge: 2011-11-14 | DRG: 148 | Disposition: A | Discharge status: Home / Self Care | Payer: BC Managed Care – PPO | Source: Ambulatory Visit | Attending: General Surgery | Admitting: General Surgery

## 2011-11-07 DIAGNOSIS — Z23 Encounter for immunization: Secondary | ICD-10-CM

## 2011-11-07 DIAGNOSIS — K630 Abscess of intestine: Secondary | ICD-10-CM | POA: Diagnosis present

## 2011-11-07 DIAGNOSIS — K631 Perforation of intestine (nontraumatic): Secondary | ICD-10-CM

## 2011-11-07 DIAGNOSIS — Z01812 Encounter for preprocedural laboratory examination: Secondary | ICD-10-CM

## 2011-11-07 DIAGNOSIS — K5732 Diverticulitis of large intestine without perforation or abscess without bleeding: Secondary | ICD-10-CM

## 2011-11-07 HISTORY — PX: PARTIAL COLECTOMY: SHX5273

## 2011-11-07 LAB — TYPE AND SCREEN: ABO/RH(D): A POS

## 2011-11-07 SURGERY — LAPAROSCOPIC PARTIAL COLECTOMY
Anesthesia: General | Site: Abdomen | Wound class: Contaminated

## 2011-11-07 MED ORDER — ACETAMINOPHEN 10 MG/ML IV SOLN
INTRAVENOUS | Status: AC
  Filled 2011-11-07: qty 100

## 2011-11-07 MED ORDER — MORPHINE SULFATE (PF) 1 MG/ML IV SOLN
INTRAVENOUS | Status: AC
  Administered 2011-11-07: 4.5 mg
  Filled 2011-11-07: qty 25

## 2011-11-07 MED ORDER — NALOXONE HCL 0.4 MG/ML IJ SOLN: 0.4000 mg | INTRAMUSCULAR | Status: DC | PRN

## 2011-11-07 MED ORDER — CEFOXITIN SODIUM-DEXTROSE 1-4 GM-% IV SOLR (PREMIX)
INTRAVENOUS | Status: AC
  Filled 2011-11-07: qty 100

## 2011-11-07 MED ORDER — ROCURONIUM BROMIDE 100 MG/10ML IV SOLN
INTRAVENOUS | Status: DC | PRN
  Administered 2011-11-07 (×3): 25 mg via INTRAVENOUS
  Administered 2011-11-07: 50 mg via INTRAVENOUS
  Administered 2011-11-07: 20 mg via INTRAVENOUS

## 2011-11-07 MED ORDER — DIPHENHYDRAMINE HCL 12.5 MG/5ML PO ELIX: 12.5000 mg | ORAL_SOLUTION | Freq: Four times a day (QID) | ORAL | Status: DC | PRN

## 2011-11-07 MED ORDER — ALVIMOPAN 12 MG PO CAPS
12.0000 mg | ORAL_CAPSULE | Freq: Once | ORAL | Status: AC
  Administered 2011-11-07: 12 mg via ORAL
  Filled 2011-11-07: qty 1

## 2011-11-07 MED ORDER — ONDANSETRON HCL 4 MG/2ML IJ SOLN: 4.0000 mg | Freq: Four times a day (QID) | INTRAMUSCULAR | Status: DC | PRN

## 2011-11-07 MED ORDER — DEXAMETHASONE SODIUM PHOSPHATE 10 MG/ML IJ SOLN
INTRAMUSCULAR | Status: DC | PRN
  Administered 2011-11-07: 10 mg via INTRAVENOUS

## 2011-11-07 MED ORDER — GLYCOPYRROLATE 0.2 MG/ML IJ SOLN
INTRAMUSCULAR | Status: DC | PRN
  Administered 2011-11-07: .8 mg via INTRAVENOUS

## 2011-11-07 MED ORDER — NEOSTIGMINE METHYLSULFATE 1 MG/ML IJ SOLN
INTRAMUSCULAR | Status: DC | PRN
  Administered 2011-11-07: 5 mg via INTRAVENOUS

## 2011-11-07 MED ORDER — LACTATED RINGERS IV SOLN
INTRAVENOUS | Status: DC | PRN
  Administered 2011-11-07: 1000 mL

## 2011-11-07 MED ORDER — PROPOFOL 10 MG/ML IV BOLUS
INTRAVENOUS | Status: DC | PRN
  Administered 2011-11-07: 200 mg via INTRAVENOUS

## 2011-11-07 MED ORDER — HYDROMORPHONE HCL PF 1 MG/ML IJ SOLN: 0.2500 mg | INTRAMUSCULAR | Status: DC | PRN

## 2011-11-07 MED ORDER — DIPHENHYDRAMINE HCL 50 MG/ML IJ SOLN: 12.5000 mg | Freq: Four times a day (QID) | INTRAMUSCULAR | Status: DC | PRN

## 2011-11-07 MED ORDER — ACETAMINOPHEN 10 MG/ML IV SOLN
INTRAVENOUS | Status: DC | PRN
  Administered 2011-11-07: 1000 mg via INTRAVENOUS

## 2011-11-07 MED ORDER — LACTATED RINGERS IV SOLN: INTRAVENOUS | Status: DC

## 2011-11-07 MED ORDER — MIDAZOLAM HCL 5 MG/5ML IJ SOLN
INTRAMUSCULAR | Status: DC | PRN
  Administered 2011-11-07: 2 mg via INTRAVENOUS

## 2011-11-07 MED ORDER — PIPERACILLIN-TAZOBACTAM 3.375 G IVPB
3.3750 g | Freq: Three times a day (TID) | INTRAVENOUS | Status: DC
  Administered 2011-11-07 – 2011-11-14 (×20): 3.375 g via INTRAVENOUS
  Filled 2011-11-07 (×22): qty 50

## 2011-11-07 MED ORDER — MORPHINE SULFATE (PF) 1 MG/ML IV SOLN
INTRAVENOUS | Status: DC
  Administered 2011-11-07: 7.38 mg via INTRAVENOUS
  Administered 2011-11-07 (×2): via INTRAVENOUS
  Administered 2011-11-07: 10.5 mg via INTRAVENOUS
  Administered 2011-11-08 (×2): 3 mg via INTRAVENOUS
  Administered 2011-11-08: via INTRAVENOUS
  Administered 2011-11-08: 4.5 mg via INTRAVENOUS
  Administered 2011-11-08 (×2): 3 mg via INTRAVENOUS
  Administered 2011-11-09: 7.5 mg via INTRAVENOUS
  Administered 2011-11-09: 3 mg via INTRAVENOUS
  Administered 2011-11-09: 5.45 mg via INTRAVENOUS
  Administered 2011-11-09 – 2011-11-10 (×3): 1.5 mg via INTRAVENOUS
  Administered 2011-11-10: 1.7 mg via INTRAVENOUS
  Administered 2011-11-10 – 2011-11-11 (×3): 1.5 mg via INTRAVENOUS
  Administered 2011-11-11: 3 mg via INTRAVENOUS
  Administered 2011-11-11: 1.5 mg via INTRAVENOUS
  Filled 2011-11-07 (×2): qty 25

## 2011-11-07 MED ORDER — SODIUM CHLORIDE 0.9 % IJ SOLN: 9.0000 mL | INTRAMUSCULAR | Status: DC | PRN

## 2011-11-07 MED ORDER — FENTANYL CITRATE 0.05 MG/ML IJ SOLN
INTRAMUSCULAR | Status: DC | PRN
  Administered 2011-11-07: 100 ug via INTRAVENOUS
  Administered 2011-11-07: 50 ug via INTRAVENOUS
  Administered 2011-11-07 (×3): 100 ug via INTRAVENOUS

## 2011-11-07 MED ORDER — HEPARIN SODIUM (PORCINE) 5000 UNIT/ML IJ SOLN
5000.0000 [IU] | Freq: Three times a day (TID) | INTRAMUSCULAR | Status: DC
  Administered 2011-11-08 – 2011-11-14 (×18): 5000 [IU] via SUBCUTANEOUS
  Filled 2011-11-07 (×21): qty 1

## 2011-11-07 MED ORDER — PROMETHAZINE HCL 25 MG/ML IJ SOLN: 6.2500 mg | INTRAMUSCULAR | Status: DC | PRN

## 2011-11-07 MED ORDER — DEXTROSE 5 % IV SOLN
2.0000 g | INTRAVENOUS | Status: AC
  Administered 2011-11-07: 2 g via INTRAVENOUS

## 2011-11-07 MED ORDER — ALVIMOPAN 12 MG PO CAPS
12.0000 mg | ORAL_CAPSULE | Freq: Two times a day (BID) | ORAL | Status: DC
  Administered 2011-11-08 – 2011-11-11 (×7): 12 mg via ORAL
  Filled 2011-11-07 (×8): qty 1

## 2011-11-07 MED ORDER — BUPIVACAINE HCL (PF) 0.5 % IJ SOLN
INTRAMUSCULAR | Status: AC
  Filled 2011-11-07: qty 30

## 2011-11-07 MED ORDER — LACTATED RINGERS IV SOLN
INTRAVENOUS | Status: DC
  Administered 2011-11-07 (×2): via INTRAVENOUS
  Administered 2011-11-07: 1000 mL via INTRAVENOUS
  Administered 2011-11-07: 10:00:00 via INTRAVENOUS

## 2011-11-07 MED ORDER — ONDANSETRON HCL 4 MG PO TABS: 4.0000 mg | ORAL_TABLET | Freq: Four times a day (QID) | ORAL | Status: DC | PRN

## 2011-11-07 MED ORDER — ONDANSETRON HCL 4 MG/2ML IJ SOLN
INTRAMUSCULAR | Status: DC | PRN
  Administered 2011-11-07: 4 mg via INTRAVENOUS

## 2011-11-07 MED ORDER — KCL IN DEXTROSE-NACL 20-5-0.9 MEQ/L-%-% IV SOLN
INTRAVENOUS | Status: DC
  Administered 2011-11-07 – 2011-11-08 (×2): via INTRAVENOUS
  Administered 2011-11-08: 1000 mL via INTRAVENOUS
  Administered 2011-11-09 – 2011-11-11 (×5): via INTRAVENOUS
  Filled 2011-11-07 (×10): qty 1000

## 2011-11-07 SURGICAL SUPPLY — 82 items
APPLIER CLIP 5 13 M/L LIGAMAX5 (MISCELLANEOUS)
APPLIER CLIP ROT 10 11.4 M/L (STAPLE)
BLADE EXTENDED COATED 6.5IN (ELECTRODE) IMPLANT
BLADE HEX COATED 2.75 (ELECTRODE) ×2 IMPLANT
BLADE SURG SZ10 CARB STEEL (BLADE) ×2 IMPLANT
CABLE HIGH FREQUENCY MONO STRZ (ELECTRODE) ×4 IMPLANT
CANISTER SUCTION 2500CC (MISCELLANEOUS) ×2 IMPLANT
CANNULA ENDOPATH XCEL 11M (ENDOMECHANICALS) IMPLANT
CELLS DAT CNTRL 66122 CELL SVR (MISCELLANEOUS) IMPLANT
CLIP APPLIE 5 13 M/L LIGAMAX5 (MISCELLANEOUS) IMPLANT
CLIP APPLIE ROT 10 11.4 M/L (STAPLE) IMPLANT
CLOTH BEACON ORANGE TIMEOUT ST (SAFETY) ×2 IMPLANT
COVER MAYO STAND STRL (DRAPES) ×2 IMPLANT
DECANTER SPIKE VIAL GLASS SM (MISCELLANEOUS) ×2 IMPLANT
DISSECTOR BLUNT TIP ENDO 5MM (MISCELLANEOUS) IMPLANT
DRAIN CHANNEL 19F RND (DRAIN) ×2 IMPLANT
DRAPE LAPAROSCOPIC ABDOMINAL (DRAPES) ×2 IMPLANT
DRAPE LG THREE QUARTER DISP (DRAPES) ×2 IMPLANT
DRAPE UTILITY XL STRL (DRAPES) ×2 IMPLANT
DRAPE WARM FLUID 44X44 (DRAPE) ×2 IMPLANT
DRESSING TELFA 8X3 (GAUZE/BANDAGES/DRESSINGS) ×2 IMPLANT
ELECT REM PT RETURN 9FT ADLT (ELECTROSURGICAL) ×2
ELECTRODE REM PT RTRN 9FT ADLT (ELECTROSURGICAL) ×1 IMPLANT
EVACUATOR DRAINAGE 10X20 100CC (DRAIN) ×1 IMPLANT
EVACUATOR SILICONE 100CC (DRAIN) ×1
FILTER SMOKE EVAC LAPAROSHD (FILTER) IMPLANT
GLOVE BIOGEL PI IND STRL 7.0 (GLOVE) ×1 IMPLANT
GLOVE BIOGEL PI IND STRL 7.5 (GLOVE) ×1 IMPLANT
GLOVE BIOGEL PI INDICATOR 7.0 (GLOVE) ×1
GLOVE BIOGEL PI INDICATOR 7.5 (GLOVE) ×1
GLOVE ECLIPSE 8.0 STRL XLNG CF (GLOVE) ×4 IMPLANT
GLOVE EUDERMIC 7 POWDERFREE (GLOVE) ×4 IMPLANT
GLOVE INDICATOR 8.0 STRL GRN (GLOVE) ×4 IMPLANT
GLOVE SURG SS PI 6.5 STRL IVOR (GLOVE) ×2 IMPLANT
GOWN STRL NON-REIN LRG LVL3 (GOWN DISPOSABLE) ×6 IMPLANT
GOWN STRL REIN XL XLG (GOWN DISPOSABLE) ×4 IMPLANT
KIT BASIN OR (CUSTOM PROCEDURE TRAY) ×2 IMPLANT
LEGGING LITHOTOMY PAIR STRL (DRAPES) ×2 IMPLANT
LIGASURE IMPACT 36 18CM CVD LR (INSTRUMENTS) ×2 IMPLANT
NS IRRIG 1000ML POUR BTL (IV SOLUTION) ×4 IMPLANT
PENCIL BUTTON HOLSTER BLD 10FT (ELECTRODE) ×2 IMPLANT
RELOAD PROXIMATE 75MM BLUE (ENDOMECHANICALS) ×2 IMPLANT
RTRCTR WOUND ALEXIS 18CM MED (MISCELLANEOUS)
SCALPEL HARMONIC ACE (MISCELLANEOUS) IMPLANT
SCISSORS LAP 5X35 DISP (ENDOMECHANICALS) ×2 IMPLANT
SET IRRIG TUBING LAPAROSCOPIC (IRRIGATION / IRRIGATOR) ×2 IMPLANT
SLEEVE XCEL OPT CAN 5 100 (ENDOMECHANICALS) IMPLANT
SOLUTION ANTI FOG 6CC (MISCELLANEOUS) ×2 IMPLANT
SPONGE GAUZE 4X4 12PLY (GAUZE/BANDAGES/DRESSINGS) ×2 IMPLANT
SPONGE LAP 18X18 X RAY DECT (DISPOSABLE) ×4 IMPLANT
STAPLER CIRC CVD 29MM 37CM (STAPLE) ×2 IMPLANT
STAPLER PROXIMATE 75MM BLUE (STAPLE) ×2 IMPLANT
STAPLER VISISTAT 35W (STAPLE) ×2 IMPLANT
SUCTION POOLE TIP (SUCTIONS) ×2 IMPLANT
SUT ETHILON 3 0 PS 1 (SUTURE) ×2 IMPLANT
SUT PDS AB 1 CTX 36 (SUTURE) IMPLANT
SUT PDS AB 1 TP1 96 (SUTURE) ×6 IMPLANT
SUT PROLENE 2 0 BLUE (SUTURE) ×2 IMPLANT
SUT PROLENE 2 0 SH DA (SUTURE) IMPLANT
SUT SILK 2 0 (SUTURE) ×1
SUT SILK 2 0 SH CR/8 (SUTURE) ×2 IMPLANT
SUT SILK 2-0 18XBRD TIE 12 (SUTURE) ×1 IMPLANT
SUT SILK 3 0 (SUTURE) ×1
SUT SILK 3 0 SH CR/8 (SUTURE) ×2 IMPLANT
SUT SILK 3-0 18XBRD TIE 12 (SUTURE) ×1 IMPLANT
SUT VIC AB 0 CT1 27 (SUTURE) ×1
SUT VIC AB 0 CT1 27XBRD ANTBC (SUTURE) ×1 IMPLANT
SUT VIC AB 2-0 SH 18 (SUTURE) ×2 IMPLANT
SUT VICRYL 2 0 18  UND BR (SUTURE) ×1
SUT VICRYL 2 0 18 UND BR (SUTURE) ×1 IMPLANT
SYS LAPSCP GELPORT 120MM (MISCELLANEOUS) ×2
SYSTEM LAPSCP GELPORT 120MM (MISCELLANEOUS) ×1 IMPLANT
TOWEL OR 17X26 10 PK STRL BLUE (TOWEL DISPOSABLE) ×2 IMPLANT
TRAY FOLEY CATH 14FRSI W/METER (CATHETERS) ×2 IMPLANT
TRAY LAP CHOLE (CUSTOM PROCEDURE TRAY) ×2 IMPLANT
TROCAR BLADELESS OPT 5 100 (ENDOMECHANICALS) IMPLANT
TROCAR BLADELESS OPT 5 75 (ENDOMECHANICALS) ×10 IMPLANT
TROCAR XCEL BLUNT TIP 100MML (ENDOMECHANICALS) IMPLANT
TROCAR XCEL NON-BLD 11X100MML (ENDOMECHANICALS) IMPLANT
TUBING INSUFFLATION 10FT LAP (TUBING) ×2 IMPLANT
YANKAUER SUCT BULB TIP 10FT TU (MISCELLANEOUS) ×2 IMPLANT
YANKAUER SUCT BULB TIP NO VENT (SUCTIONS) ×2 IMPLANT

## 2011-11-07 NOTE — Anesthesia Postprocedure Evaluation (Signed)
Anesthesia Post Note  Patient: Paul Sloan  Procedure(s) Performed: Procedure(s) (LRB): LAPAROSCOPIC PARTIAL COLECTOMY (N/A)  Anesthesia type: General  Patient location: PACU  Post pain: Pain level controlled  Post assessment: Post-op Vital signs reviewed  Last Vitals:  Filed Vitals:   11/07/11 1400  BP: 135/77  Pulse: 87  Temp:   Resp: 20    Post vital signs: Reviewed  Level of consciousness: sedated  Complications: No apparent anesthesia complications

## 2011-11-07 NOTE — Brief Op Note (Signed)
11/07/2011  1:17 PM  PATIENT:  Paul Sloan  51 y.o. male  PRE-OPERATIVE DIAGNOSIS:  sigmoid diverticulitis  POST-OPERATIVE DIAGNOSIS:  sigmoid diverticulitis  PROCEDURE:  Procedure(s) (LRB) with comments: LAPAROSCOPIC PARTIAL COLECTOMY (N/A) - Laparoscopic assisted sigmoid colectomy with mobilization of the splenic flexure  SURGEON:  Surgeon(s) and Role:    * Adolph Pollack, MD - Primary    * Ernestene Mention, MD - Assisting  PHYSICIAN ASSISTANT:   Alvera Singh, PA-S  ANESTHESIA:   general  EBL:  Total I/O In: 2000 [I.V.:2000] Out: 475 [Urine:275; Blood:200]  BLOOD ADMINISTERED:none  DRAINS: (pelvis) Jackson-Pratt drain(s) with closed bulb suction in the pelvis   LOCAL MEDICATIONS USED:  MARCAINE     SPECIMEN:  Source of Specimen:  sigmoid colon  DISPOSITION OF SPECIMEN:  PATHOLOGY  COUNTS:  Correct  TOURNIQUET:  * No tourniquets in log *  DICTATION: .Dragon Dictation  PLAN OF CARE: Admit to inpatient   PATIENT DISPOSITION:  PACU - hemodynamically stable.   Delay start of Pharmacological VTE agent (>24hrs) due to surgical blood loss or risk of bleeding: no

## 2011-11-07 NOTE — Transfer of Care (Signed)
Immediate Anesthesia Transfer of Care Note  Patient: Paul Sloan  Procedure(s) Performed: Procedure(s) (LRB) with comments: LAPAROSCOPIC PARTIAL COLECTOMY (N/A) - Laparoscopic assisted sigmoid colectomy  Patient Location: PACU  Anesthesia Type: General  Level of Consciousness: awake, sedated and patient cooperative  Airway & Oxygen Therapy: Patient Spontanous Breathing and Patient connected to face mask oxygen  Post-op Assessment: Report given to PACU RN and Post -op Vital signs reviewed and stable  Post vital signs: Reviewed and stable  Complications: No apparent anesthesia complications

## 2011-11-07 NOTE — Anesthesia Preprocedure Evaluation (Signed)
Anesthesia Evaluation  Patient identified by MRN, date of birth, ID band Patient awake    Reviewed: Allergy & Precautions, H&P , NPO status , Patient's Chart, lab work & pertinent test results  Airway Mallampati: II TM Distance: >3 FB Neck ROM: Full    Dental  (+) Teeth Intact and Dental Advisory Given   Pulmonary neg pulmonary ROS,  breath sounds clear to auscultation  Pulmonary exam normal       Cardiovascular negative cardio ROS  Rhythm:Regular Rate:Normal     Neuro/Psych negative neurological ROS  negative psych ROS   GI/Hepatic Neg liver ROS, Diverticulitis with perforation of colon.   Endo/Other  negative endocrine ROS  Renal/GU negative Renal ROS  negative genitourinary   Musculoskeletal negative musculoskeletal ROS (+)   Abdominal   Peds negative pediatric ROS (+)  Hematology negative hematology ROS (+)   Anesthesia Other Findings   Reproductive/Obstetrics negative OB ROS                           Anesthesia Physical Anesthesia Plan  ASA: II  Anesthesia Plan: General   Post-op Pain Management:    Induction: Intravenous  Airway Management Planned: Oral ETT  Additional Equipment:   Intra-op Plan:   Post-operative Plan: Extubation in OR  Informed Consent: I have reviewed the patients History and Physical, chart, labs and discussed the procedure including the risks, benefits and alternatives for the proposed anesthesia with the patient or authorized representative who has indicated his/her understanding and acceptance.   Dental advisory given  Plan Discussed with: CRNA  Anesthesia Plan Comments:         Anesthesia Quick Evaluation

## 2011-11-07 NOTE — Interval H&P Note (Signed)
History and Physical Interval Note:  11/07/2011 9:47 AM  Paul Sloan  has presented today for surgery, with the diagnosis of sigmoid diverticulitis  The various methods of treatment have been discussed with the patient and family. After consideration of risks, benefits and other options for treatment, the patient has consented to  Procedure(s) (LRB) with comments: LAPAROSCOPIC PARTIAL COLECTOMY (N/A) - Laparoscopic Partical Colectomy as a surgical intervention .  The patient's history has been reviewed, patient examined, no change in status, stable for surgery.  I have reviewed the patient's chart and labs.  Questions were answered to the patient's satisfaction.     Jaiel Saraceno Shela Commons

## 2011-11-07 NOTE — Progress Notes (Signed)
Lt hand less swollen. Remains elevated.

## 2011-11-07 NOTE — Progress Notes (Signed)
Lt hand swollen. Elevated on chest and wrapped with warm blanket.

## 2011-11-07 NOTE — Op Note (Signed)
Operative Note  Paul Sloan male 51 y.o. 11/07/2011  PREOPERATIVE DX:  Recurrent sigmoid diverticulitis  POSTOPERATIVE DX:  Same  PROCEDURE:  Laparoscopic assisted sigmoid colectomy with mobilization of splenic flexure         Surgeon: Adolph Pollack   Assistants: Claud Kelp M.D.  Anesthesia: General endotracheal anesthesia  Indications:   This is a 51 year old male with multiple bouts of sigmoid diverticulitis. He now presents for elective sigmoid colectomy. The procedure and risks have been discussed with him preoperatively.    Procedure Detail:  He was brought to the operating room placed supine on the operating table and a general anesthetic was administered. A Foley catheter and oral gastric tube were inserted. The hair on the abdominal wall was clipped. The abdominal wall and pelvic areas were sterilely prepped and draped.  He was placed in slight reverse Trendelenburg position. A small incision was made in a previous left upper quadrant scar and using a 5 mm Optiview trocar and laparoscope access was gained into the peritoneal cavity. A pneumoperitoneum was created. Visualization of the area beneath the trocar demonstrated no evidence of organ injury or bleeding. There were adhesions between the omentum and colon and anterior abdominal wall noted in the pelvic and lower abdominal region. A 5 mm trocar was placed in the right lower quadrant. A 5 mm trocar was placed in the supraumbilical incision through an old scar. A 5 mm trocar was placed in the left lower quadrant. A 5 mm trocar was placed in the subumbilical region.  Using blunt and sharp dissection I freed up some of the adhesions between the sigmoid colon and lower abdominal wall which appeared to be inflammatory in nature. There also appeared to be adhesions between the cecum and the inflammatory process of the sigmoid colon. I then mobilized the proximal sigmoid colon and descending colon by dividing the  lateral attachments. The splenic flexure was mobilized sharply and with blunt dissection. I continued to mobilize the distal half of the transverse colon until I had good mobility of the descending colon down to the pelvis.  There were dense adhesions between the abdominal wall and the affected segment of the sigmoid colon and I was not able to separate laparoscopically. The subumbilical trocar was removed and then made a limited midline incision and inserted a wound protector and a GelPort device. Using hand assistance, I was able to bluntly break up the adhesions between the abdominal wall and sigmoid colon. I was also able to mobilize some of the inflamed sigmoid colon. I could not separate the cecum from the inflammatory sigmoid colon however. I removed the GelPort and lengthened the lower midline incision. Under direct vision, I was able to separate adhesions from the cecum and distal ileum from the sigmoid colon process and exposed what to be a former abscess bed in the mesentery of the distal ileum. The small bowel was not obstructed. The distal ileum and the cecum were intact.  During mobilization of the descending colon, I was suspicious for a small serosal tear. This was identified and repaired with interrupted 3-0 silk sutures.  Next, I chose a point of normal-appearing descending colon proximal to the affected sigmoid colon and divided this with the GIA stapler. I then divided the mesentery close to the colon with the LigaSure device and stayed away from the ureter. I continued the division of the mesentery down to the proximal third of the rectum which appeared normal. I divided the rectum with the GIA stapler.  I marked the anterior aspect of the specimen with a suture in an sent to pathology.  Rigid proctosigmoidoscopy was performed and we were able to see the entire rectal pouch without difficulty. I then removed the staple line from the descending colon and placed a size 29 EEA stapler anvil  into the descending colon. I sealed the defect with the GIA stapler. I then brought the rod of the anvil out the side of the descending colon and placed a pursestring suture of 2-0 Prolene around it to secure it. The handle of the EEA stapler was then passed up Into the rectum and an end rectum to side descending colon (Baker-type) staple anastomosis was performed. 2 solid donuts were retrieved from the stapling device. The colon proximal to the anastomosis was occluded and the anastomosis was placed under water. Air was insufflated into the rectum and there is no evidence of leak. The air was evacuated.  The abdominal cavity was then copiously irrigated with saline solution. The distal small bowel and cecum were inspected and were intact. The left lower quadrant trocar was removed and a size 19 Blake drain placed through this tract into the pelvis. It was anchored to the skin with 3-0 nylon suture. Instrument and sponge counts were requested and were reported to be correct at this time.  The peritoneum of the lower midline wound was then closed with running 0 Vicryl suture. The fascia of the lower midline wound is closed with a double looped #1 PDS suture. Repeat laparoscopy was performed and the fascial closure was solid. For quadrant inspection was performed and there is no evidence of bleeding or organ injury.  The trocars were removed and the pneumoperitoneum was released. The trocar skin incisions were closed with staples. The lower midline wound was closed loosely with staples. Telfa gauze wicks were then placed in between the staples. The drain was hooked up to closed bulb suction. Sterile dressings were then applied to the wounds.  He tolerated the procedure well without any apparent complications and was taken to the recovery in satisfactory condition.  Estimated Blood Loss:  200 mL         Drains: JACKSON-PRATT (JP)  Blood Given: none          Specimens: Sigmoid colon          Complications:  * No complications entered in OR log *         Disposition: PACU - hemodynamically stable.         Condition: stable

## 2011-11-07 NOTE — H&P (Signed)
Paul Sloan is an 51 y.o. male.   Chief Complaint:   Here for elective partial colectomy HPI:   He originally had perf diverticulitis and periappendicitis operated  on 06/18/2011-irrigation, drainage, and appendectomy. Then hospitalized from 07/18/2011- 07/21/2011 with diverticular abscess. He had a CT guided abscess drainage on 07/19/2011. He had another episode of diverticulitis requiring hospital admission last month.  He was treated with IV antibiotics and has been on oral antibiotics since that time without any problem.  All of this has been done with the hope of doing a single stage procedure.     Past Medical History  Diagnosis Date  . Diverticulitis of colon with perforation   . Sinusitis     Past Surgical History  Procedure Date  . Inguinal hernia repair 20 years ago    bilateral  . Laparoscopy 06/18/2011    Procedure: LAPAROSCOPY DIAGNOSTIC;  Surgeon: Adolph Pollack, MD;  Location: [facility];  Service: General;  Laterality: N/A;  Laparoscopic irrigation of abdominal cavity with appendectomy  . Vasectomy 2001  . Appendectomy   . Diverticulium surgery 06/19/2011  . Rhinoplasty     History reviewed. No pertinent family history. Social History:  reports that he has never smoked. He has never used smokeless tobacco. He reports that he drinks alcohol. He reports that he does not use illicit drugs.  Allergies: No Known Allergies  Medications Prior to Admission  Medication Sig Dispense Refill  . amoxicillin-clavulanate (AUGMENTIN) 875-125 MG per tablet Take 1 tablet by mouth 2 (two) times daily.      Marland Kitchen ibuprofen (ADVIL,MOTRIN) 200 MG tablet Take 200 mg by mouth every 8 (eight) hours as needed. For pain.        Results for orders placed during the hospital encounter of 11/07/11 (from the past 48 hour(s))  ABO/RH     Status: Normal   Collection Time   11/07/11  8:00 AM      Component Value Range Comment   ABO/RH(D) A POS     TYPE AND SCREEN     Status: Normal   Collection Time    11/07/11  8:30 AM      Component Value Range Comment   ABO/RH(D) A POS      Antibody Screen NEG      Sample Expiration 11/10/2011      No results found.  Review of Systems  Constitutional: Negative for fever and chills.  Gastrointestinal: Negative for abdominal pain.    Blood pressure 122/78, pulse 78, temperature 97.8 F (36.6 C), temperature source Oral, resp. rate 16, SpO2 97.00%. Physical Exam  Constitutional: He appears well-developed and well-nourished. No distress.  HENT:  Head: Normocephalic and atraumatic.  Cardiovascular: Normal rate and regular rhythm.   Respiratory: Effort normal and breath sounds normal.  GI: Soft. He exhibits no mass. There is no tenderness.       Multiple small scars.  Musculoskeletal: He exhibits no edema.  Lymphadenopathy:    He has no cervical adenopathy.  Skin: Skin is warm and dry.     Assessment/Plan Recurrent sigmoid diverticulitis  Plan:  Laparoscopic partial colectomy, possible colostomy.  The procedure and risks have been explained to him.  Elvan Ebron J 11/07/2011, 9:42 AM

## 2011-11-08 ENCOUNTER — Encounter (HOSPITAL_COMMUNITY): Payer: Self-pay | Admitting: *Deleted

## 2011-11-08 DIAGNOSIS — K5732 Diverticulitis of large intestine without perforation or abscess without bleeding: Secondary | ICD-10-CM | POA: Diagnosis present

## 2011-11-08 LAB — CBC
HCT: 41.1 % (ref 39.0–52.0)
Hemoglobin: 13.8 g/dL (ref 13.0–17.0)
MCH: 29.8 pg (ref 26.0–34.0)
RBC: 4.63 MIL/uL (ref 4.22–5.81)

## 2011-11-08 LAB — BASIC METABOLIC PANEL
CO2: 26 mEq/L (ref 19–32)
Chloride: 100 mEq/L (ref 96–112)
GFR calc non Af Amer: >90 mL/min (ref >90)
Glucose, Bld: 122 mg/dL — ABNORMAL HIGH (ref 70–99)
Potassium: 4 mEq/L (ref 3.5–5.1)
Sodium: 136 mEq/L (ref 135–145)

## 2011-11-08 MED ORDER — BIOTENE DRY MOUTH MT LIQD
15.0000 mL | Freq: Two times a day (BID) | OROMUCOSAL | Status: DC
  Administered 2011-11-08 – 2011-11-12 (×6): 15 mL via OROMUCOSAL

## 2011-11-08 MED ORDER — CHLORHEXIDINE GLUCONATE 0.12 % MT SOLN
15.0000 mL | Freq: Two times a day (BID) | OROMUCOSAL | Status: DC
  Administered 2011-11-08 – 2011-11-13 (×8): 15 mL via OROMUCOSAL
  Filled 2011-11-08 (×15): qty 15

## 2011-11-08 MED ORDER — INFLUENZA VIRUS VACC SPLIT PF IM SUSP
0.5000 mL | INTRAMUSCULAR | Status: AC
  Administered 2011-11-09: 0.5 mL via INTRAMUSCULAR
  Filled 2011-11-08: qty 0.5

## 2011-11-08 NOTE — Plan of Care (Signed)
Problem: Phase I Progression Outcomes Goal: OOB as tolerated unless otherwise ordered Outcome: Progressing Patient assisted to stand by bed for few minutes. Tolerated well. Belched some and increased flow of urine from foley when stool up. "Felt better" stated patient.

## 2011-11-08 NOTE — Progress Notes (Signed)
1 Day Post-Op  Subjective: Pain well controlled.  No nausea.  Objective: Vital signs in last 24 hours: Temp:  [97.5 F (36.4 C)-98.3 F (36.8 C)] 97.8 F (36.6 C) (09/25 0620) Pulse Rate:  [76-105] 76  (09/25 0620) Resp:  [17-22] 19  (09/25 0620) BP: (116-166)/(72-88) 116/72 mmHg (09/25 0620) SpO2:  [95 %-100 %] 99 % (09/25 0620) Weight:  [233 lb (105.688 kg)] 233 lb (105.688 kg) (09/24 2120) Last BM Date: 11/06/11  Intake/Output from previous day: 09/24 0701 - 09/25 0700 In: 5245.9 [I.V.:5115.9; IV Piggyback:100] Out: 2700 [Urine:2225; Drains:275; Blood:200] Intake/Output this shift:    PE: Abd-soft, quiet, not significantly distended, dried drainage on lower midline dressing.  Lab Results:   Basename 11/08/11 0350  WBC 17.8*  HGB 13.8  HCT 41.1  PLT 239   BMET  Basename 11/08/11 0350  NA 136  K 4.0  CL 100  CO2 26  GLUCOSE 122*  BUN 7  CREATININE 0.99  CALCIUM 9.1   PT/INR No results found for this basename: LABPROT:2,INR:2 in the last 72 hours Comprehensive Metabolic Panel:    Component Value Date/Time   NA 136 11/08/2011 0350   K 4.0 11/08/2011 0350   CL 100 11/08/2011 0350   CO2 26 11/08/2011 0350   BUN 7 11/08/2011 0350   CREATININE 0.99 11/08/2011 0350   GLUCOSE 122* 11/08/2011 0350   CALCIUM 9.1 11/08/2011 0350   AST 17 11/02/2011 1500   ALT 26 11/02/2011 1500   ALKPHOS 67 11/02/2011 1500   BILITOT 0.3 11/02/2011 1500   PROT 7.3 11/02/2011 1500   ALBUMIN 3.9 11/02/2011 1500     Studies/Results: No results found.  Anti-infectives: Anti-infectives     Start     Dose/Rate Route Frequency Ordered Stop   11/07/11 1600  piperacillin-tazobactam (ZOSYN) IVPB 3.375 g       3.375 g 12.5 mL/hr over 240 Minutes Intravenous Every 8 hours 11/07/11 1458     11/07/11 0800   cefOXitin (MEFOXIN) 2 g in dextrose 5 % 50 mL IVPB        2 g 100 mL/hr over 30 Minutes Intravenous 60 min pre-op 11/07/11 0800 11/07/11 0955          Assessment Principal  Problem:  *Recurrent Sigmoid diverticulitis s/p lap assisted sigmoid colectomy 11/07/11-operative findings explained to him; on Zosyn; stable overnight.    LOS: 1 day   Plan: Sips of liquids.  Mobilize.   Kaylyn Garrow Shela Commons 11/08/2011

## 2011-11-08 NOTE — Plan of Care (Signed)
Problem: Phase I Progression Outcomes Goal: Other Phase I Outcomes/Goals Outcome: Progressing Given IS. Patient instructed on use and demonstrated ability to use.

## 2011-11-09 LAB — CBC
HCT: 42.1 % (ref 39.0–52.0)
MCH: 29.7 pg (ref 26.0–34.0)
MCV: 90.5 fL (ref 78.0–100.0)
RDW: 12.9 % (ref 11.5–15.5)
WBC: 13.5 10*3/uL — ABNORMAL HIGH (ref 4.0–10.5)

## 2011-11-09 NOTE — Progress Notes (Signed)
2 Days Post-Op  Subjective: Tolerating clear liquids.  No flatus or BM.  Voiding okay.  Objective: Vital signs in last 24 hours: Temp:  [98.3 F (36.8 C)-99.1 F (37.3 C)] 99.1 F (37.3 C) (09/26 0825) Pulse Rate:  [79-97] 96  (09/26 0825) Resp:  [16-24] 23  (09/26 0812) BP: (122-132)/(78-89) 129/83 mmHg (09/26 0825) SpO2:  [96 %-100 %] 99 % (09/26 0825) FiO2 (%):  [36 %-38 %] 38 % (09/26 0812) Last BM Date: 11/06/11  Intake/Output from previous day: 09/25 0701 - 09/26 0700 In: 2672.5 [P.O.:240; I.V.:2432.5] Out: 5135 [Urine:5025; Drains:110] Intake/Output this shift: Total I/O In: -  Out: 675 [Urine:675]  PE: Abd-soft, active bowel sounds, incision are clean and intact, lower midline incision with wicks in, drain output serous  Lab Results:   Basename 11/09/11 0420 11/08/11 0350  WBC 13.5* 17.8*  HGB 13.8 13.8  HCT 42.1 41.1  PLT 257 239   BMET  Basename 11/08/11 0350  NA 136  K 4.0  CL 100  CO2 26  GLUCOSE 122*  BUN 7  CREATININE 0.99  CALCIUM 9.1   PT/INR No results found for this basename: LABPROT:2,INR:2 in the last 72 hours Comprehensive Metabolic Panel:    Component Value Date/Time   NA 136 11/08/2011 0350   K 4.0 11/08/2011 0350   CL 100 11/08/2011 0350   CO2 26 11/08/2011 0350   BUN 7 11/08/2011 0350   CREATININE 0.99 11/08/2011 0350   GLUCOSE 122* 11/08/2011 0350   CALCIUM 9.1 11/08/2011 0350   AST 17 11/02/2011 1500   ALT 26 11/02/2011 1500   ALKPHOS 67 11/02/2011 1500   BILITOT 0.3 11/02/2011 1500   PROT 7.3 11/02/2011 1500   ALBUMIN 3.9 11/02/2011 1500     Studies/Results: No results found.  Anti-infectives: Anti-infectives     Start     Dose/Rate Route Frequency Ordered Stop   11/07/11 1600  piperacillin-tazobactam (ZOSYN) IVPB 3.375 g       3.375 g 12.5 mL/hr over 240 Minutes Intravenous Every 8 hours 11/07/11 1458     11/07/11 0800   cefOXitin (MEFOXIN) 2 g in dextrose 5 % 50 mL IVPB        2 g 100 mL/hr over 30 Minutes Intravenous  60 min pre-op 11/07/11 0800 11/07/11 0955          Assessment Principal Problem:  *Recurrent Sigmoid diverticulitis s/p lap assisted sigmoid colectomy 11/07/11-progressing satisfactorily    LOS: 2 days   Plan: Full liquid diet.  Continue Zosyn.  Await pathology results.   Paul Sloan Shela Commons 11/09/2011

## 2011-11-10 NOTE — Progress Notes (Signed)
3 Days Post-Op  Subjective: Passing a fair amount of gas.  No BM.  Gets bloated some after eating.  Objective: Vital signs in last 24 hours: Temp:  [98.4 F (36.9 C)-99.6 F (37.6 C)] 98.4 F (36.9 C) (09/27 0603) Pulse Rate:  [79-93] 79  (09/27 0603) Resp:  [18-23] 23  (09/27 0603) BP: (124-145)/(81-91) 124/81 mmHg (09/27 0603) SpO2:  [99 %-100 %] 99 % (09/27 0603) FiO2 (%):  [36 %-39 %] 36 % (09/27 0431) Last BM Date: 11/06/11  Intake/Output from previous day: 09/26 0701 - 09/27 0700 In: 2910.1 [P.O.:480; I.V.:2430.1] Out: 3915 [Urine:3750; Drains:165] Intake/Output this shift: Total I/O In: -  Out: 600 [Urine:600]  PE: Abd-soft, incisions are clean and intact, lower midline incision with wicks in, drain output serous  Lab Results:   Basename 11/09/11 0420 11/08/11 0350  WBC 13.5* 17.8*  HGB 13.8 13.8  HCT 42.1 41.1  PLT 257 239   BMET  Basename 11/08/11 0350  NA 136  K 4.0  CL 100  CO2 26  GLUCOSE 122*  BUN 7  CREATININE 0.99  CALCIUM 9.1   PT/INR No results found for this basename: LABPROT:2,INR:2 in the last 72 hours Comprehensive Metabolic Panel:    Component Value Date/Time   NA 136 11/08/2011 0350   K 4.0 11/08/2011 0350   CL 100 11/08/2011 0350   CO2 26 11/08/2011 0350   BUN 7 11/08/2011 0350   CREATININE 0.99 11/08/2011 0350   GLUCOSE 122* 11/08/2011 0350   CALCIUM 9.1 11/08/2011 0350   AST 17 11/02/2011 1500   ALT 26 11/02/2011 1500   ALKPHOS 67 11/02/2011 1500   BILITOT 0.3 11/02/2011 1500   PROT 7.3 11/02/2011 1500   ALBUMIN 3.9 11/02/2011 1500     Studies/Results: No results found.  Anti-infectives: Anti-infectives     Start     Dose/Rate Route Frequency Ordered Stop   11/07/11 1600   piperacillin-tazobactam (ZOSYN) IVPB 3.375 g        3.375 g 12.5 mL/hr over 240 Minutes Intravenous Every 8 hours 11/07/11 1458     11/07/11 0800   cefOXitin (MEFOXIN) 2 g in dextrose 5 % 50 mL IVPB        2 g 100 mL/hr over 30 Minutes Intravenous 60  min pre-op 11/07/11 0800 11/07/11 0955          Assessment Principal Problem:  *Recurrent Sigmoid diverticulitis s/p lap assisted sigmoid colectomy 11/07/11-bowel function starting to return; path shows diverticulitis with abscess (explained to him).    LOS: 3 days   Plan: Continue full liquids.   Continue Zosyn due to abscess. Advance diet when bloating is better.  Remove drain at discharge.     Anyely Cunning Shela Commons 11/10/2011

## 2011-11-11 MED ORDER — OXYCODONE HCL 5 MG PO TABS
10.0000 mg | ORAL_TABLET | ORAL | Status: DC | PRN
  Administered 2011-11-11 – 2011-11-12 (×3): 10 mg via ORAL
  Filled 2011-11-11 (×3): qty 2

## 2011-11-11 MED ORDER — MORPHINE SULFATE 2 MG/ML IJ SOLN: 2.0000 mg | INTRAMUSCULAR | Status: DC | PRN

## 2011-11-11 NOTE — Progress Notes (Signed)
4 Days Post-Op  Subjective: Passing a fair amount of gas.  2 loose BM's last night.  Still gets bloated some after eating.  Ambulated 3 times yesterday.  Objective: Vital signs in last 24 hours: Temp:  [97.9 F (36.6 C)-98.6 F (37 C)] 98.6 F (37 C) (09/28 0655) Pulse Rate:  [78-86] 78  (09/28 0655) Resp:  [20-23] 20  (09/28 0800) BP: (132-148)/(85-94) 132/85 mmHg (09/28 0655) SpO2:  [97 %-100 %] 100 % (09/28 0800) Last BM Date: 11/11/11  Intake/Output from previous day: 09/27 0701 - 09/28 0700 In: 1500 [P.O.:1500] Out: 3390 [Urine:3250; Drains:140] Intake/Output this shift:    PE: Abd-soft, incisions are clean and intact, lower midline incision with wicks in, drain output serous  Lab Results:   Basename 11/09/11 0420  WBC 13.5*  HGB 13.8  HCT 42.1  PLT 257   BMET No results found for this basename: NA:2,K:2,CL:2,CO2:2,GLUCOSE:2,BUN:2,CREATININE:2,CALCIUM:2 in the last 72 hours PT/INR No results found for this basename: LABPROT:2,INR:2 in the last 72 hours Comprehensive Metabolic Panel:    Component Value Date/Time   NA 136 11/08/2011 0350   K 4.0 11/08/2011 0350   CL 100 11/08/2011 0350   CO2 26 11/08/2011 0350   BUN 7 11/08/2011 0350   CREATININE 0.99 11/08/2011 0350   GLUCOSE 122* 11/08/2011 0350   CALCIUM 9.1 11/08/2011 0350   AST 17 11/02/2011 1500   ALT 26 11/02/2011 1500   ALKPHOS 67 11/02/2011 1500   BILITOT 0.3 11/02/2011 1500   PROT 7.3 11/02/2011 1500   ALBUMIN 3.9 11/02/2011 1500     Studies/Results: No results found.  Anti-infectives: Anti-infectives     Start     Dose/Rate Route Frequency Ordered Stop   11/07/11 1600   piperacillin-tazobactam (ZOSYN) IVPB 3.375 g        3.375 g 12.5 mL/hr over 240 Minutes Intravenous Every 8 hours 11/07/11 1458     11/07/11 0800   cefOXitin (MEFOXIN) 2 g in dextrose 5 % 50 mL IVPB        2 g 100 mL/hr over 30 Minutes Intravenous 60 min pre-op 11/07/11 0800 11/07/11 0955          Assessment Principal  Problem:  *Recurrent Sigmoid diverticulitis s/p lap assisted sigmoid colectomy 11/07/11-bowel function starting to return; path shows diverticulitis with abscess (explained to him).    LOS: 4 days   Plan: Continue full liquids.   Continue Zosyn due to abscess. Will advance diet when bloating is better.  Pt requesting to stay on Fulls.  Remove drain at discharge.  D/C PCA and start PO narcotics.  Will start PO abx when nausea/bloating better.  Cont to ambulate.     Brecklyn Galvis C. 11/11/2011

## 2011-11-11 NOTE — Progress Notes (Signed)
Pharmacy Brief Note - Alvimopan (Entereg)  The standing order set for alvimopan (Entereg) now includes an automatic order to discontinue the drug after the patient has had a bowel movement.  The change was approved by the Pharmacy & Therapeutics Committee and the Medical Executive Committee.    This patient has had a bowel movement documented by nursing.  Therefore, alvimopan has been discontinued.  If there are questions, please contact the pharmacy at [phone].  Thank you  Lynann Beaver PharmD, BCPS Pager [phone] 11/11/2011 2:37 PM

## 2011-11-12 NOTE — Progress Notes (Signed)
5 Days Post-Op  Subjective: Having loose BM's.  Still getting nausea and bloating some after eating.  Ambulating well.  Feels better than yesterday.  Tolerating PO meds.    Objective: Vital signs in last 24 hours: Temp:  [98 F (36.7 C)-99.5 F (37.5 C)] 98.9 F (37.2 C) (09/29 0515) Pulse Rate:  [76-81] 78  (09/29 0515) Resp:  [18] 18  (09/29 0515) BP: (111-134)/(74-84) 111/74 mmHg (09/29 0515) SpO2:  [97 %-99 %] 97 % (09/29 0515) Last BM Date: 11/11/11  Intake/Output from previous day: 09/28 0701 - 09/29 0700 In: 1250 [P.O.:1200; IV Piggyback:50] Out: 2390 [Urine:2300; Drains:90] Intake/Output this shift: Total I/O In: 240 [P.O.:240] Out: -   PE: Abd-soft, incisions are clean and intact, lower midline incision with wicks in, drain output serous  Lab Results:  No results found for this basename: WBC:2,HGB:2,HCT:2,PLT:2 in the last 72 hours BMET No results found for this basename: NA:2,K:2,CL:2,CO2:2,GLUCOSE:2,BUN:2,CREATININE:2,CALCIUM:2 in the last 72 hours PT/INR No results found for this basename: LABPROT:2,INR:2 in the last 72 hours Comprehensive Metabolic Panel:    Component Value Date/Time   NA 136 11/08/2011 0350   K 4.0 11/08/2011 0350   CL 100 11/08/2011 0350   CO2 26 11/08/2011 0350   BUN 7 11/08/2011 0350   CREATININE 0.99 11/08/2011 0350   GLUCOSE 122* 11/08/2011 0350   CALCIUM 9.1 11/08/2011 0350   AST 17 11/02/2011 1500   ALT 26 11/02/2011 1500   ALKPHOS 67 11/02/2011 1500   BILITOT 0.3 11/02/2011 1500   PROT 7.3 11/02/2011 1500   ALBUMIN 3.9 11/02/2011 1500     Studies/Results: No results found.  Anti-infectives: Anti-infectives     Start     Dose/Rate Route Frequency Ordered Stop   11/07/11 1600   piperacillin-tazobactam (ZOSYN) IVPB 3.375 g        3.375 g 12.5 mL/hr over 240 Minutes Intravenous Every 8 hours 11/07/11 1458     11/07/11 0800   cefOXitin (MEFOXIN) 2 g in dextrose 5 % 50 mL IVPB        2 g 100 mL/hr over 30 Minutes Intravenous 60 min  pre-op 11/07/11 0800 11/07/11 0955          Assessment Principal Problem:  *Recurrent Sigmoid diverticulitis s/p lap assisted sigmoid colectomy 11/07/11-bowel function starting to return; path shows diverticulitis with abscess (explained to him).    LOS: 5 days   Plan: Continue full liquids per patient request.   Continue Zosyn due to abscess. Will advance diet when nausea/bloating are better. Remove drain at discharge.  Will start PO abx when nausea/bloating better.  Cont to ambulate.     Paul Korte C. 11/12/2011

## 2011-11-13 NOTE — Progress Notes (Signed)
6 Days Post-Op  Subjective: Nausea is much better.  Objective: Vital signs in last 24 hours: Temp:  [97.8 F (36.6 C)-99.5 F (37.5 C)] 98.6 F (37 C) (09/30 0529) Pulse Rate:  [77-80] 77  (09/30 0529) Resp:  [18] 18  (09/30 0529) BP: (103-130)/(69-81) 103/69 mmHg (09/30 0529) SpO2:  [97 %-99 %] 97 % (09/30 0529) Last BM Date: 11/12/11  Intake/Output from previous day: 09/29 0701 - 09/30 0700 In: 1010 [P.O.:960; IV Piggyback:50] Out: 3755 [Urine:3700; Drains:55] Intake/Output this shift: Total I/O In: -  Out: 700 [Urine:700]  PE: Abd-soft, drain output is serous, wounds are clean, wicks in lower midline wound were advanced out some  Lab Results:  No results found for this basename: WBC:2,HGB:2,HCT:2,PLT:2 in the last 72 hours BMET No results found for this basename: NA:2,K:2,CL:2,CO2:2,GLUCOSE:2,BUN:2,CREATININE:2,CALCIUM:2 in the last 72 hours PT/INR No results found for this basename: LABPROT:2,INR:2 in the last 72 hours Comprehensive Metabolic Panel:    Component Value Date/Time   NA 136 11/08/2011 0350   K 4.0 11/08/2011 0350   CL 100 11/08/2011 0350   CO2 26 11/08/2011 0350   BUN 7 11/08/2011 0350   CREATININE 0.99 11/08/2011 0350   GLUCOSE 122* 11/08/2011 0350   CALCIUM 9.1 11/08/2011 0350   AST 17 11/02/2011 1500   ALT 26 11/02/2011 1500   ALKPHOS 67 11/02/2011 1500   BILITOT 0.3 11/02/2011 1500   PROT 7.3 11/02/2011 1500   ALBUMIN 3.9 11/02/2011 1500     Studies/Results: No results found.  Anti-infectives: Anti-infectives     Start     Dose/Rate Route Frequency Ordered Stop   11/07/11 1600  piperacillin-tazobactam (ZOSYN) IVPB 3.375 g       3.375 g 12.5 mL/hr over 240 Minutes Intravenous Every 8 hours 11/07/11 1458     11/07/11 0800   cefOXitin (MEFOXIN) 2 g in dextrose 5 % 50 mL IVPB        2 g 100 mL/hr over 30 Minutes Intravenous 60 min pre-op 11/07/11 0800 11/07/11 0955          Assessment Principal Problem:  *Recurrent Sigmoid diverticulitis  s/p lap assisted sigmoid colectomy 11/07/11-continues to slowly improve    LOS: 6 days   Plan: Bland diet.  Hopefully will be ready for discharge tomorrow.   Paul Sloan 11/13/2011

## 2011-11-14 MED ORDER — OXYCODONE HCL 10 MG PO TABS: 5.0000 mg | ORAL_TABLET | ORAL | Status: DC | PRN

## 2011-11-14 NOTE — Progress Notes (Signed)
7 Days Post-Op  Subjective: Tolerating diet.  Bowels moving.  Objective: Vital signs in last 24 hours: Temp:  [97.4 F (36.3 C)-98.4 F (36.9 C)] 97.4 F (36.3 C) (10/01 0530) Pulse Rate:  [77-84] 81  (10/01 0530) Resp:  [18] 18  (10/01 0530) BP: (107-119)/(74-80) 107/74 mmHg (10/01 0530) SpO2:  [97 %-98 %] 98 % (10/01 0530) Last BM Date: 11/13/11  Intake/Output from previous day: 09/30 0701 - 10/01 0700 In: 990 [P.O.:790; IV Piggyback:200] Out: 4630 [Urine:4550; Drains:80] Intake/Output this shift:    PE: Abd-soft, drain output serous, wounds clean, wicks removed from lower midline wound, staples removed and steri strips applied to all wounds, drain removed.  Lab Results:  No results found for this basename: WBC:2,HGB:2,HCT:2,PLT:2 in the last 72 hours BMET No results found for this basename: NA:2,K:2,CL:2,CO2:2,GLUCOSE:2,BUN:2,CREATININE:2,CALCIUM:2 in the last 72 hours PT/INR No results found for this basename: LABPROT:2,INR:2 in the last 72 hours Comprehensive Metabolic Panel:    Component Value Date/Time   NA 136 11/08/2011 0350   K 4.0 11/08/2011 0350   CL 100 11/08/2011 0350   CO2 26 11/08/2011 0350   BUN 7 11/08/2011 0350   CREATININE 0.99 11/08/2011 0350   GLUCOSE 122* 11/08/2011 0350   CALCIUM 9.1 11/08/2011 0350   AST 17 11/02/2011 1500   ALT 26 11/02/2011 1500   ALKPHOS 67 11/02/2011 1500   BILITOT 0.3 11/02/2011 1500   PROT 7.3 11/02/2011 1500   ALBUMIN 3.9 11/02/2011 1500     Studies/Results: No results found.  Anti-infectives: Anti-infectives     Start     Dose/Rate Route Frequency Ordered Stop   11/07/11 1600  piperacillin-tazobactam (ZOSYN) IVPB 3.375 g       3.375 g 12.5 mL/hr over 240 Minutes Intravenous Every 8 hours 11/07/11 1458     11/07/11 0800   cefOXitin (MEFOXIN) 2 g in dextrose 5 % 50 mL IVPB        2 g 100 mL/hr over 30 Minutes Intravenous 60 min pre-op 11/07/11 0800 11/07/11 0955          Assessment Principal Problem:  *Recurrent Sigmoid diverticulitis s/p lap assisted sigmoid colectomy 11/07/11-progressing well.    LOS: 7 days   Plan: Discharge.  Instructions given.   Bastien Strawser J 11/14/2011

## 2011-11-14 NOTE — Progress Notes (Signed)
Patient discharged via wheelchair. Assessment unchanged. jp and staples removed by Dr. Abbey Chatters this am. States understanding of discharge instructions and RX given for oxycodone

## 2011-11-14 NOTE — Care Management Note (Signed)
    Page 1 of 1   11/14/2011     12:35:38 PM   CARE MANAGEMENT NOTE 11/14/2011  Patient:  Paul Sloan, Paul Sloan   Account Number:  [id number]  Date Initiated:  11/13/2011  Documentation initiated by:  Lorenda Ishihara  Subjective/Objective Assessment:   51 yo male admitted s/p sigmoid colectomy for diverticulitis. PTA lived at home with spouse.     Action/Plan:   Anticipated DC Date:  11/14/2011   Anticipated DC Plan:  HOME/SELF CARE      DC Planning Services  CM consult      Choice offered to / List presented to:             Status of service:  Completed, signed off Medicare Important Message given?   (If response is "NO", the following Medicare IM given date fields will be blank) Date Medicare IM given:   Date Additional Medicare IM given:    Discharge Disposition:  HOME/SELF CARE  Per UR Regulation:  Reviewed for med. necessity/level of care/duration of stay  If discussed at Long Length of Stay Meetings, dates discussed:    Comments:

## 2011-11-20 ENCOUNTER — Telehealth (INDEPENDENT_AMBULATORY_CARE_PROVIDER_SITE_OTHER): Payer: Self-pay

## 2011-11-20 NOTE — Discharge Summary (Signed)
Physician Discharge Summary  Patient ID: Paul Sloan MRN: [id number] DOB/AGE: 20-Jun-1960 52 y.o.  Admit date: 11/07/2011 Discharge date: 11/14/2011  Admission Diagnoses:  Recurrent sigmoid diverticulitis  Discharge Diagnoses:  Principal Problem:  *Recurrent Sigmoid diverticulitis s/p lap assisted sigmoid colectomy 11/07/11   Post operative ileus   Discharged Condition: good  Hospital Course: He underwent the above procedure. Postoperatively he did develop a mild persistent ileus which eventually improved. He was on a liquid diet but eventually we were able to advance it to a regular diet. His bowels began moving. He was ambulatory. His limited lower midline wound had wicks in it which were gently worked out. His staples were removed from all wounds and Steri-Strips applied and he was able to be discharged on postop day #7. He was given discharge instructions.  Consults: None  Significant Diagnostic Studies: None  Treatments: surgery: Laparoscopic assisted sigmoid colectomy  Discharge Exam: Blood pressure 107/74, pulse 81, temperature 97.4 F (36.3 C), temperature source Oral, resp. rate 18, height 6\' 2"  (1.88 m), weight 233 lb (105.688 kg), SpO2 98.00%.   Disposition: 01-Home or Self Care  Discharge Orders    Future Appointments: Provider: Department: Dept Phone: Center:   12/05/2011 11:30 AM Adolph Pollack, MD [facility] [phone] None       Medication List     As of 11/20/2011 12:36 PM    STOP taking these medications         amoxicillin-clavulanate 875-125 MG per tablet   Commonly known as: AUGMENTIN      TAKE these medications         ibuprofen 200 MG tablet   Commonly known as: ADVIL,MOTRIN   Take 200 mg by mouth every 8 (eight) hours as needed. For pain.      Oxycodone HCl 10 MG Tabs   Take 0.5 tablets (5 mg total) by mouth every 4 (four) hours as needed (may take 5 or 10 mg).         Signed: Adolph Pollack 11/20/2011, 12:36  PM

## 2011-11-20 NOTE — Telephone Encounter (Signed)
Pt has improved since the weekend.  His mother is a Engineer, civil (consulting), and she says the wound looks fine.  Drainage has slowed down, and pink color is gone.  Advised him to call our office if any changes.  He agreed.

## 2011-11-29 ENCOUNTER — Encounter (INDEPENDENT_AMBULATORY_CARE_PROVIDER_SITE_OTHER): Payer: Self-pay | Admitting: General Surgery

## 2011-11-29 ENCOUNTER — Ambulatory Visit (INDEPENDENT_AMBULATORY_CARE_PROVIDER_SITE_OTHER): Payer: BC Managed Care – PPO | Admitting: General Surgery

## 2011-11-29 VITALS — BP 138/90 | HR 72 | Temp 98.2°F | Resp 16 | Ht 74.0 in | Wt 224.8 lb

## 2011-11-29 DIAGNOSIS — Z9889 Other specified postprocedural states: Secondary | ICD-10-CM

## 2011-11-29 NOTE — Progress Notes (Signed)
Procedure:  Laparoscopic assisted sigmoid colectomy  Date:  11/07/2011  Pathology:  Diverticulitis with abscess formation, no bleeding site  History:  He is here for his first postoperative visit. He is having decreasing drainage from the open areas in the wound. He is eating well and his bowels are moving.  Exam: General- Is in NAD.  Abdomen-soft, one staple is remaining in the lower midline incision and this was removed, granulation tissue is present in 3 areas and this was treated with silver nitrate.  Assessment:  Doing well postoperatively overall.  Plan:  Continue applying a dry dressing to the wound daily. Continue light activities. May do desk work. Return visit 3 weeks.

## 2011-11-29 NOTE — Patient Instructions (Signed)
Continue light activities.  May do desk work.

## 2011-12-05 ENCOUNTER — Encounter (INDEPENDENT_AMBULATORY_CARE_PROVIDER_SITE_OTHER): Payer: BC Managed Care – PPO | Admitting: General Surgery

## 2011-12-20 ENCOUNTER — Ambulatory Visit (INDEPENDENT_AMBULATORY_CARE_PROVIDER_SITE_OTHER): Payer: BC Managed Care – PPO | Admitting: General Surgery

## 2011-12-20 ENCOUNTER — Encounter (INDEPENDENT_AMBULATORY_CARE_PROVIDER_SITE_OTHER): Payer: Self-pay | Admitting: General Surgery

## 2011-12-20 VITALS — BP 128/64 | HR 84 | Temp 97.7°F | Resp 16 | Ht 74.0 in | Wt 232.4 lb

## 2011-12-20 DIAGNOSIS — Z9889 Other specified postprocedural states: Secondary | ICD-10-CM

## 2011-12-20 NOTE — Progress Notes (Signed)
Procedure:  Laparoscopic assisted sigmoid colectomy  Date:  11/07/2011  Pathology:  Diverticulitis with abscess formation  History:  He is here for his second postoperative visit.   He is doing well.  He is on a high fiber diet.  He is having a bowel movement every day. Exam: General- Is in NAD.  Abdomen-soft, incisions are completely healed.  Assessment:  He continues to do well.  Plan:  Continue high fiber diet.  Resume normal activities as tolerated.  Return visit prn.

## 2011-12-20 NOTE — Patient Instructions (Signed)
Activities as tolerated as well discussed.

## 2012-08-31 IMAGING — CT CT ABD-PELV W/ CM
1 of 2 series · 15 of 32 positions shown, 19 images · IV contrast (omnipaque)
Comparison: 07/18/2011

CLINICAL DATA: Right lower quadrant abdominal pain

CT ABDOMEN AND PELVIS WITH CONTRAST
TECHNIQUE: Multidetector CT imaging of the abdomen and pelvis was
performed following the standard protocol during bolus
administration of intravenous contrast.
Contrast: 100mL OMNIPAQUE IOHEXOL 300 MG/ML  SOLN

[Series 2: abd/pel with · axial · 0.93mm/px · z∈[+1410,+1860]mm · 15 of 100 slices shown, 19 images]
[im 5/100  soft-tissue]
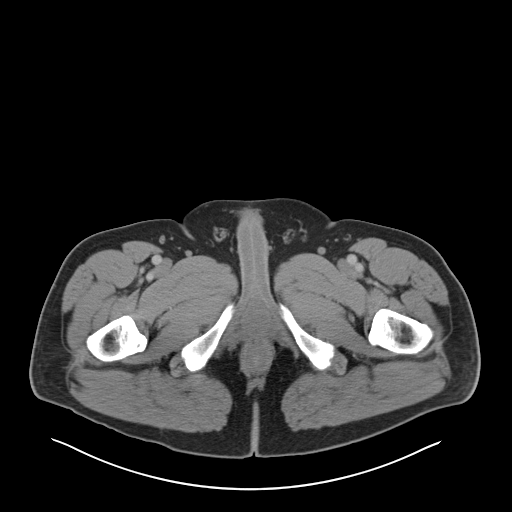
[im 5/100  bone]
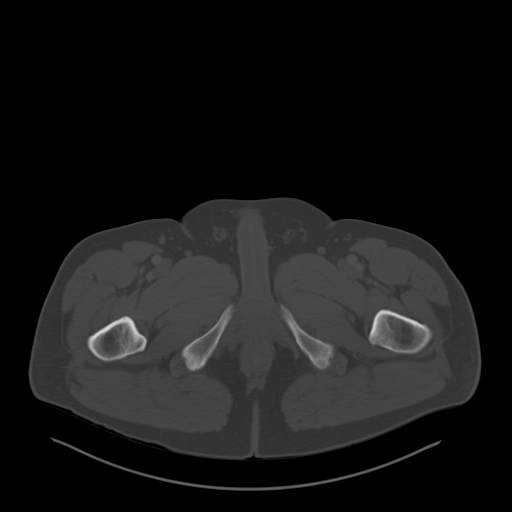
[im 13/100  soft-tissue]
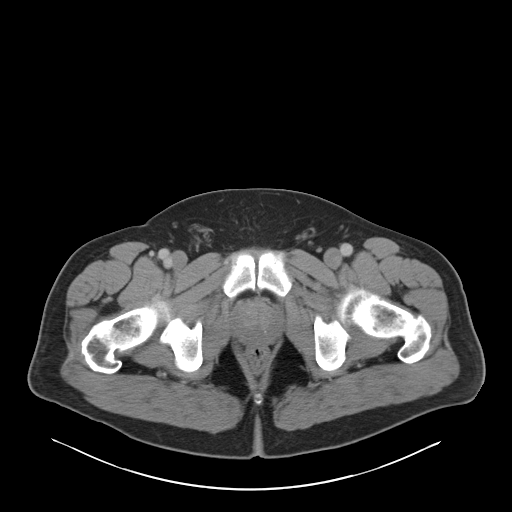
[im 21/100  soft-tissue]
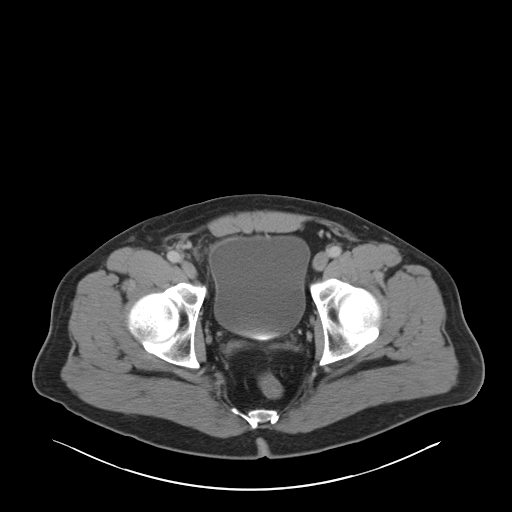
[im 29/100  soft-tissue]
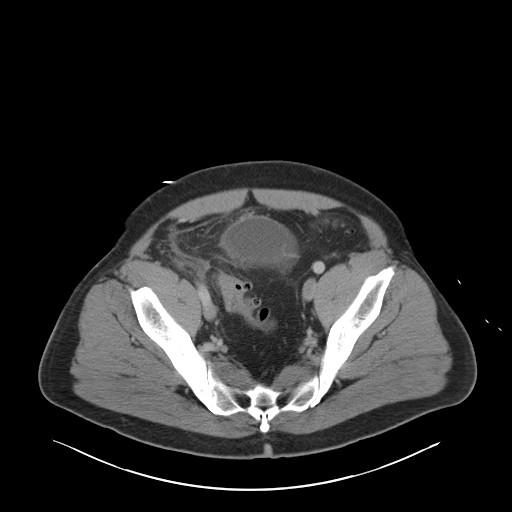
[im 34/100  soft-tissue]
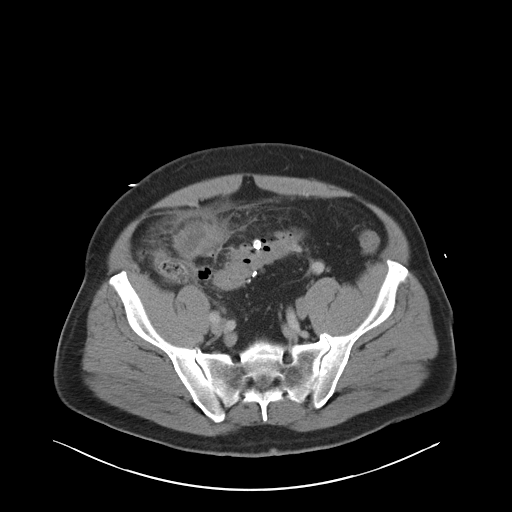
[im 42/100  soft-tissue]
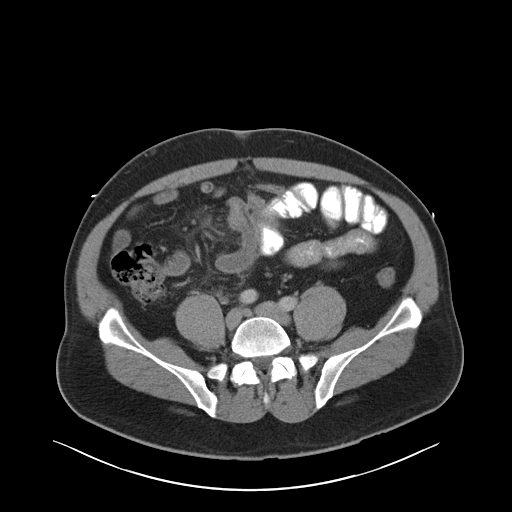
[im 50/100  soft-tissue]
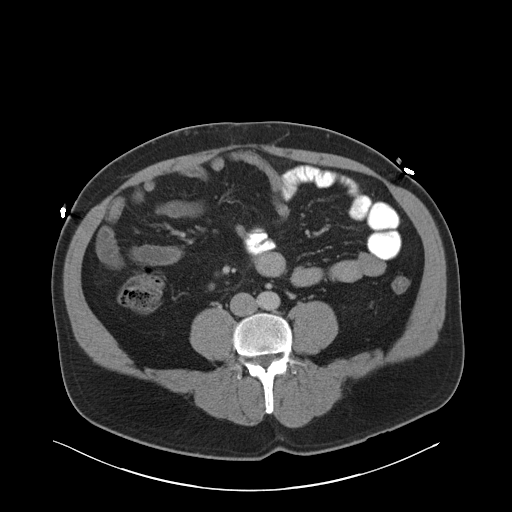
[im 58/100  soft-tissue]
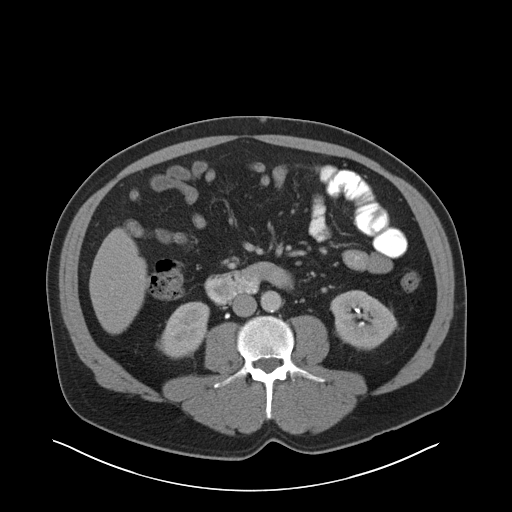
[im 67/100  soft-tissue]
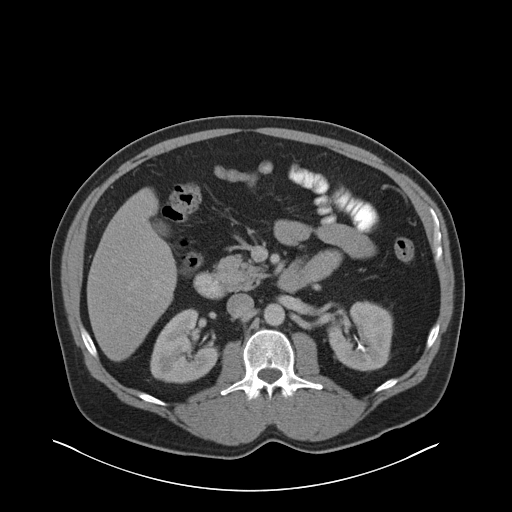
[im 67/100  bone]
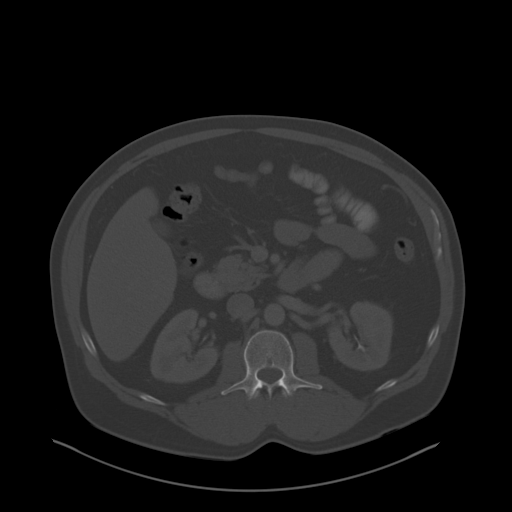
[im 71/100  soft-tissue]
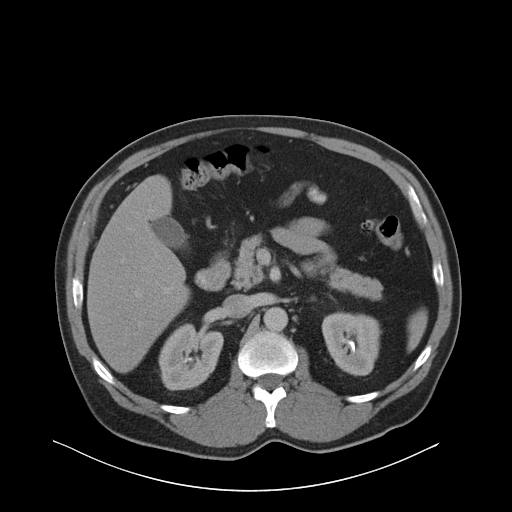
[im 79/100  soft-tissue]
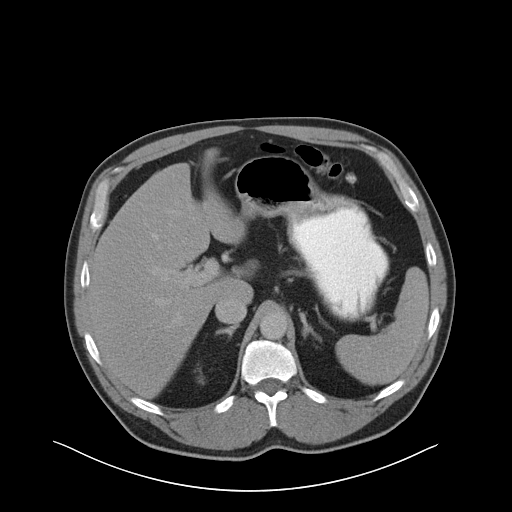
[im 83/100  lung]
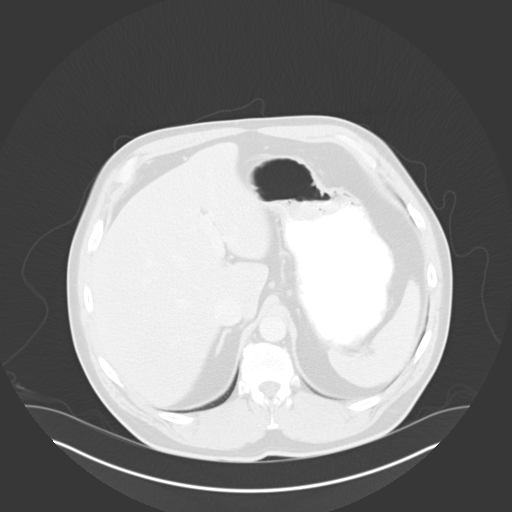
[im 87/100  soft-tissue]
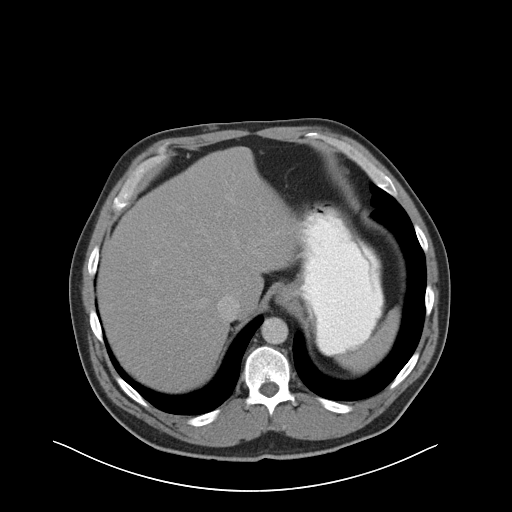
[im 87/100  lung]
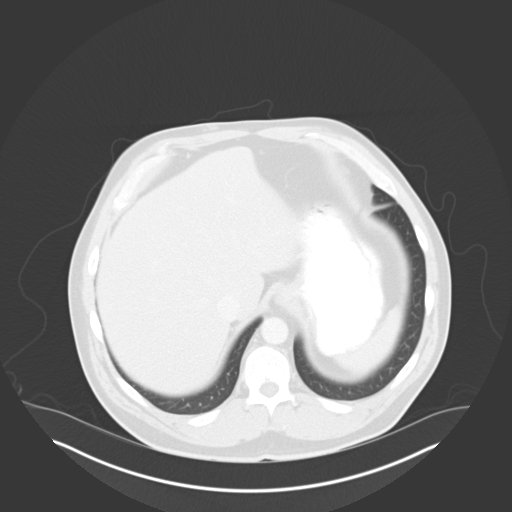
[im 91/100  lung]
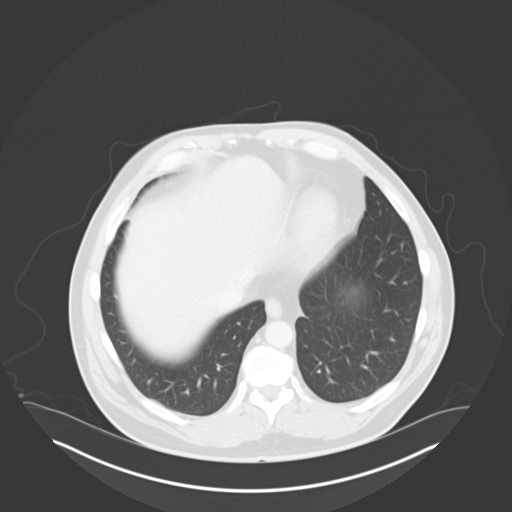
[im 95/100  soft-tissue]
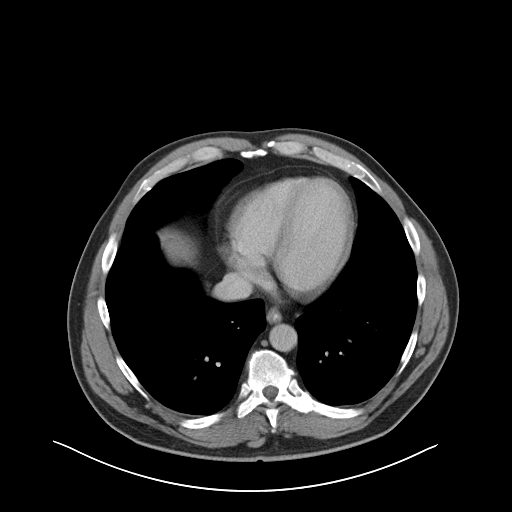
[im 95/100  lung]
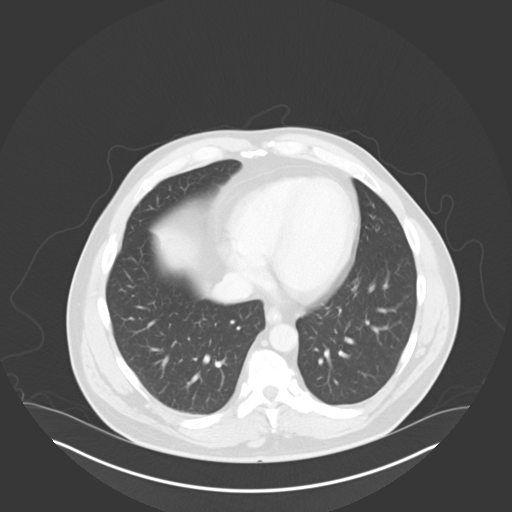

[15 of 32 positions shown; findings below may reference images not displayed]

FINDINGS: Lung bases are clear.

Liver, spleen, pancreas, and adrenal glands are within normal
limits.

Cholelithiasis, without associated inflammatory changes.  No
intrahepatic or extrahepatic ductal dilatation.  Kidneys are
notable for excretory contrast.  1.3 cm right upper pole cyst
(series 2/image 23).  No hydronephrosis.

No evidence of bowel obstruction.  Prior appendectomy.

Inflammatory changes with localized perforation involving the
sigmoid colon (series 2/image 68), compatible with diverticulitis.
Foci of gas tracks towards an adjacent loop of small bowel (likely
terminal ileum) without definite fistulous communication.
Surrounding mesenteric inflammatory changes.  No drainable fluid
collection/abscess at the current time.

Reactive mesenteric/ileocolic lymph nodes measuring up to 9 mm
short axis.

Atherosclerotic calcifications of the abdominal aorta and branch
vessels.

Prostatomegaly, measuring up to 5.2 cm and there is dimension, with
enlargement of the central gland.

Bladder is within normal limits.

Mild degenerative changes of the visualized thoracolumbar spine.
IMPRESSION: Sigmoid diverticulitis with associated localized perforation.

Surrounding mesenteric stranding/inflammatory changes with
secondary involvement of an adjacent loop of small bowel (likely
terminal ileum), without definite fistulous communication.

No drainable fluid collection or abscess at the current time.

These results were called by telephone on 10/10/2011 at 9873 hours
to Dr. Kiroto Sabido, who verbally acknowledged these results.

## 2014-07-27 ENCOUNTER — Encounter: Payer: Self-pay | Admitting: Podiatry

## 2014-07-27 ENCOUNTER — Ambulatory Visit (INDEPENDENT_AMBULATORY_CARE_PROVIDER_SITE_OTHER): Payer: BLUE CROSS/BLUE SHIELD | Admitting: Podiatry

## 2014-07-27 ENCOUNTER — Ambulatory Visit: Payer: BLUE CROSS/BLUE SHIELD

## 2014-07-27 VITALS — BP 133/80 | HR 79 | Temp 99.2°F | Resp 14

## 2014-07-27 DIAGNOSIS — M629 Disorder of muscle, unspecified: Secondary | ICD-10-CM

## 2014-07-27 DIAGNOSIS — R52 Pain, unspecified: Secondary | ICD-10-CM | POA: Diagnosis not present

## 2014-07-27 NOTE — Progress Notes (Signed)
   Subjective:    Patient ID: Paul Sloan, male    DOB: 12-09-1960, 54 y.o.   MRN: [id number]  HPI  54 year old male presents the office today with complaints of right arch pain which has been ongoing over the last 2 months and seems to be worsening. He states he has had he heel pain for roughly 5 years however is worse in the last couple of months. He states that it is also starting to become more swollen over the last 2 months and he has had increased pain. He went to urgent care which she has prescribed meloxicam which seem to help. He denies any specific injury or trauma. He doesn't he has been up and down a ladder more often and playing tennis which may seem to be aggravated the pain. No other complaints at this time. Denies any numbness or tingling. The pain does not waken at night  Review of Systems  Constitutional: Positive for fatigue.  Eyes: Positive for itching.  Respiratory: Positive for cough.   Musculoskeletal: Positive for back pain and gait problem.       Muscle pain, joint pain  Neurological: Positive for headaches.       Objective:   Physical Exam AAO x3, NAD DP/PT pulses palpable bilaterally, CRT less than 3 seconds Protective sensation intact with Simms Weinstein monofilament, vibratory sensation intact, Achilles tendon reflex intact Tenderness to palpation overlying the plantar medial tubercle of the calcaneus to right heel at the insertion of the plantar fascia. There is mild pain along the course of plantar fascial within the arch of the foot. Upon dorsiflexion of the hallux the plantar fascia does not appear to be as taught as the contralateral extremity and is ill-defined, concerning for a tear. There is no pain with lateral compression of the calcaneus or pain the vibratory sensation. No pain on the posterior aspect of the calcaneus or along the course/insertion of the Achilles tendon. There is no overlying edema, erythema, increase in warmth. No other areas  of tenderness palpation or pain with vibratory sensation to the foot/ankle. MMT 5/5, ROM WNL No open lesions or pre-ulcerative lesions are identified. No pain with calf compression, swelling, warmth, erythema.      Assessment & Plan:  54-year-old male with right heel/arch pain, possible plantar fascial tear, likely chronic at this point -X-rays were obtained and reviewed with the patient.  -Treatment options discussed including all alternatives, risks, and complications -Discussed likely etiology of his symptoms. -Dispensed night splint. Discussed immobilization in a cam boot however as the pain is mostly chronic will hold off on this time pending MRI results. -Plantar fascial taking was applied. -Ice to the area -Will order an MRI to evaluate the plantar fascia. -Follow-up after MRI or sooner if any problems are to arise. In the meantime I encouraged him to call the office with any questions, concerns, change in symptoms.

## 2014-07-29 ENCOUNTER — Telehealth: Payer: Self-pay | Admitting: *Deleted

## 2014-07-29 NOTE — Telephone Encounter (Signed)
Prior authorization received by Dr. Leigh Aurora PEER to [facility] for good for 30 days PA# [id number].  Faxed the prior authorization to [place] [phone].

## 2014-07-31 ENCOUNTER — Telehealth: Payer: Self-pay | Admitting: *Deleted

## 2014-07-31 ENCOUNTER — Ambulatory Visit
Admission: RE | Admit: 2014-07-31 | Discharge: 2014-07-31 | Disposition: A | Discharge status: Home / Self Care | Payer: BLUE CROSS/BLUE SHIELD | Source: Ambulatory Visit | Attending: Podiatry | Admitting: Podiatry

## 2014-07-31 DIAGNOSIS — I809 Phlebitis and thrombophlebitis of unspecified site: Secondary | ICD-10-CM

## 2014-07-31 DIAGNOSIS — M629 Disorder of muscle, unspecified: Secondary | ICD-10-CM

## 2014-07-31 DIAGNOSIS — I739 Peripheral vascular disease, unspecified: Secondary | ICD-10-CM

## 2014-07-31 NOTE — Telephone Encounter (Signed)
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  MRI report to be faxed as well.

## 2014-07-31 NOTE — Telephone Encounter (Signed)
In reference to the callback results from [facility], Dr. Jacqualyn Posey requested call to pt, asking specifically about calf pain/cramping and pain in the calf with walking.  I spoke with pt, he stated he felt a little better after the MRI and had little places of soreness in his right thigh and foot, but was better in the strap we gave him.  I told him he as a little abnormality on the MRI and we would call him back with further instructions.

## 2014-08-03 ENCOUNTER — Telehealth: Payer: Self-pay | Admitting: *Deleted

## 2014-08-03 NOTE — Telephone Encounter (Signed)
Dr. Jacqualyn Posey ordered stat right lower extremity arterial and venous dopplers r/o arteritis and thromophlebitis.  [facility] at [place] scheduled 08/05/2014 at 700am fasting to arrive at 645am.  Pt is informed and states understanding of prep.

## 2014-08-05 ENCOUNTER — Ambulatory Visit (HOSPITAL_BASED_OUTPATIENT_CLINIC_OR_DEPARTMENT_OTHER)
Admission: RE | Admit: 2014-08-05 | Discharge: 2014-08-05 | Disposition: A | Discharge status: Home / Self Care | Payer: BLUE CROSS/BLUE SHIELD | Source: Ambulatory Visit | Attending: Podiatry | Admitting: Podiatry

## 2014-08-05 ENCOUNTER — Ambulatory Visit (HOSPITAL_COMMUNITY)
Admission: RE | Admit: 2014-08-05 | Discharge: 2014-08-05 | Disposition: A | Discharge status: Home / Self Care | Payer: BLUE CROSS/BLUE SHIELD | Source: Ambulatory Visit | Attending: Cardiovascular Disease | Admitting: Cardiovascular Disease

## 2014-08-05 DIAGNOSIS — I739 Peripheral vascular disease, unspecified: Secondary | ICD-10-CM

## 2014-08-05 DIAGNOSIS — M79604 Pain in right leg: Secondary | ICD-10-CM | POA: Diagnosis not present

## 2014-08-05 DIAGNOSIS — I809 Phlebitis and thrombophlebitis of unspecified site: Secondary | ICD-10-CM

## 2014-08-11 ENCOUNTER — Telehealth: Payer: Self-pay | Admitting: *Deleted

## 2014-08-11 NOTE — Telephone Encounter (Addendum)
Pt called for follow up of MRI and Dopplers with Dr. Jacqualyn Posey.  I informed pt Dr. Jacqualyn Posey wanted him to come in, referred to schedulers.

## 2014-08-11 NOTE — Telephone Encounter (Signed)
Please schedule appt to come in. Thanks.

## 2014-08-14 ENCOUNTER — Encounter: Payer: Self-pay | Admitting: Podiatry

## 2014-08-14 ENCOUNTER — Ambulatory Visit (INDEPENDENT_AMBULATORY_CARE_PROVIDER_SITE_OTHER): Payer: BLUE CROSS/BLUE SHIELD | Admitting: Podiatry

## 2014-08-14 VITALS — BP 134/87 | HR 73 | Resp 11

## 2014-08-14 DIAGNOSIS — M79671 Pain in right foot: Secondary | ICD-10-CM | POA: Diagnosis not present

## 2014-08-14 DIAGNOSIS — M722 Plantar fascial fibromatosis: Secondary | ICD-10-CM

## 2014-08-14 MED ORDER — METHYLPREDNISOLONE 4 MG PO TBPK: ORAL_TABLET | ORAL | Status: DC

## 2014-08-24 ENCOUNTER — Encounter: Payer: Self-pay | Admitting: Podiatry

## 2014-08-24 NOTE — Progress Notes (Signed)
Patient ID: Paul Sloan, male   DOB: [birth year], 54 y.o.   MRN: [id number]  Subjective: 54 year old male presents the office today for follow-up evaluation of right heel pain and discussed MRI results. He states it is continue taking the meloxicam which seems to help alleviate his symptoms. He does believe that since last appointment his pain has decreased. He discontinued some pain to the bottom of the right heel after being on his feet for quite some time. Denies any swelling or redness. Denies any tingling or numbness. No other complaints at this time in no acute changes last appointment.  Objective: AAO x3, NAD DP/PT pulses palpable 2/4 bilaterally, CRT less than 3 seconds; pedal hair present Protective sensation intact with Simms Weinstein monofilament, vibratory sensation intact, Achilles tendon reflex intact There is decreased tenderness to palpation overlying the plantar medial tubercle of the calcaneus to right heel at the insertion of the plantar fascia. There is no pain along the course of plantar fascia within the arch of the foot. There is no pain with lateral compression of the calcaneus or pain the vibratory sensation. No pain on the posterior aspect of the calcaneus or along the course/insertion of the Achilles tendon. There is no overlying edema, erythema, increase in warmth. No other areas of tenderness palpation or pain with vibratory sensation to the foot/ankle. MMT 5/5, ROM WNL No open lesions or pre-ulcerative lesions are identified. No pain with calf compression, swelling, warmth, erythema.  Assessment: 54 year old male with right heel pain  Plan: -Treatment options discussed including all alternatives, risks, and complications -MRI results were discussed with the patient (see below) again at today's appointment. -I previously had called Dr. Gwenlyn Found from cardiology to discuss the MRI results and he recommended to hold off on anti-coagulation.  -I also discussed the  arterial and venous studies with him as well as appear to be normal. -Rx medrol dose pack. Can restart Mobic after.  -Recommend also follow-up with his primary care physician. -Follow-up 4 weeks or sooner if any problems arise. In the meantime, encouraged to call the office with any questions, concerns, change in symptoms.   MRI IMPRESSION: Dominant finding is an abnormal appearance of the posterior tibial artery and a plantar vein in the foot. Both vessels could be thrombosed and demonstrate surrounding inflammatory change compatible with arteritis and thrombophlebitis. Duplex Doppler studies and ABIs are recommended for further evaluation.  Mild appearing plantar fasciitis without tear.  Celesta Gentile, DPM

## 2014-09-11 ENCOUNTER — Encounter: Payer: Self-pay | Admitting: Podiatry

## 2014-09-11 ENCOUNTER — Ambulatory Visit (INDEPENDENT_AMBULATORY_CARE_PROVIDER_SITE_OTHER): Payer: BLUE CROSS/BLUE SHIELD | Admitting: Podiatry

## 2014-09-11 VITALS — BP 123/86 | HR 71 | Resp 18

## 2014-09-11 DIAGNOSIS — M722 Plantar fascial fibromatosis: Secondary | ICD-10-CM

## 2014-09-11 DIAGNOSIS — M79671 Pain in right foot: Secondary | ICD-10-CM | POA: Diagnosis not present

## 2014-09-11 DIAGNOSIS — I776 Arteritis, unspecified: Secondary | ICD-10-CM

## 2014-09-11 NOTE — Patient Instructions (Signed)
Plantar Fasciitis (Heel Spur Syndrome) with Rehab The plantar fascia is a fibrous, ligament-like, soft-tissue structure that spans the bottom of the foot. Plantar fasciitis is a condition that causes pain in the foot due to inflammation of the tissue. SYMPTOMS   Pain and tenderness on the underneath side of the foot.  Pain that worsens with standing or walking. CAUSES  Plantar fasciitis is caused by irritation and injury to the plantar fascia on the underneath side of the foot. Common mechanisms of injury include:  Direct trauma to bottom of the foot.  Damage to a small nerve that runs under the foot where the main fascia attaches to the heel bone.  Stress placed on the plantar fascia due to bone spurs. RISK INCREASES WITH:   Activities that place stress on the plantar fascia (running, jumping, pivoting, or cutting).  Poor strength and flexibility.  Improperly fitted shoes.  Tight calf muscles.  Flat feet.  Failure to warm-up properly before activity.  Obesity. PREVENTION  Warm up and stretch properly before activity.  Allow for adequate recovery between workouts.  Maintain physical fitness:  Strength, flexibility, and endurance.  Cardiovascular fitness.  Maintain a health body weight.  Avoid stress on the plantar fascia.  Wear properly fitted shoes, including arch supports for individuals who have flat feet. PROGNOSIS  If treated properly, then the symptoms of plantar fasciitis usually resolve without surgery. However, occasionally surgery is necessary. RELATED COMPLICATIONS   Recurrent symptoms that may result in a chronic condition.  Problems of the lower back that are caused by compensating for the injury, such as limping.  Pain or weakness of the foot during push-off following surgery.  Chronic inflammation, scarring, and partial or complete fascia tear, occurring more often from repeated injections. TREATMENT  Treatment initially involves the use of  ice and medication to help reduce pain and inflammation. The use of strengthening and stretching exercises may help reduce pain with activity, especially stretches of the Achilles tendon. These exercises may be performed at home or with a therapist. Your caregiver may recommend that you use heel cups of arch supports to help reduce stress on the plantar fascia. Occasionally, corticosteroid injections are given to reduce inflammation. If symptoms persist for greater than 6 months despite non-surgical (conservative), then surgery may be recommended.  MEDICATION   If pain medication is necessary, then nonsteroidal anti-inflammatory medications, such as aspirin and ibuprofen, or other minor pain relievers, such as acetaminophen, are often recommended.  Do not take pain medication within 7 days before surgery.  Prescription pain relievers may be given if deemed necessary by your caregiver. Use only as directed and only as much as you need.  Corticosteroid injections may be given by your caregiver. These injections should be reserved for the most serious cases, because they may only be given a certain number of times. HEAT AND COLD  Cold treatment (icing) relieves pain and reduces inflammation. Cold treatment should be applied for 10 to 15 minutes every 2 to 3 hours for inflammation and pain and immediately after any activity that aggravates your symptoms. Use ice packs or massage the area with a piece of ice (ice massage).  Heat treatment may be used prior to performing the stretching and strengthening activities prescribed by your caregiver, physical therapist, or athletic trainer. Use a heat pack or soak the injury in warm water. SEEK IMMEDIATE MEDICAL CARE IF:  Treatment seems to offer no benefit, or the condition worsens.  Any medications produce adverse side effects. EXERCISES RANGE   OF MOTION (ROM) AND STRETCHING EXERCISES - Plantar Fasciitis (Heel Spur Syndrome) These exercises may help you  when beginning to rehabilitate your injury. Your symptoms may resolve with or without further involvement from your physician, physical therapist or athletic trainer. While completing these exercises, remember:   Restoring tissue flexibility helps normal motion to return to the joints. This allows healthier, less painful movement and activity.  An effective stretch should be held for at least 30 seconds.  A stretch should never be painful. You should only feel a gentle lengthening or release in the stretched tissue. RANGE OF MOTION - Toe Extension, Flexion  Sit with your right / left leg crossed over your opposite knee.  Grasp your toes and gently pull them back toward the top of your foot. You should feel a stretch on the bottom of your toes and/or foot.  Hold this stretch for __________ seconds.  Now, gently pull your toes toward the bottom of your foot. You should feel a stretch on the top of your toes and or foot.  Hold this stretch for __________ seconds. Repeat __________ times. Complete this stretch __________ times per day.  RANGE OF MOTION - Ankle Dorsiflexion, Active Assisted  Remove shoes and sit on a chair that is preferably not on a carpeted surface.  Place right / left foot under knee. Extend your opposite leg for support.  Keeping your heel down, slide your right / left foot back toward the chair until you feel a stretch at your ankle or calf. If you do not feel a stretch, slide your bottom forward to the edge of the chair, while still keeping your heel down.  Hold this stretch for __________ seconds. Repeat __________ times. Complete this stretch __________ times per day.  STRETCH - Gastroc, Standing  Place hands on wall.  Extend right / left leg, keeping the front knee somewhat bent.  Slightly point your toes inward on your back foot.  Keeping your right / left heel on the floor and your knee straight, shift your weight toward the wall, not allowing your back to  arch.  You should feel a gentle stretch in the right / left calf. Hold this position for __________ seconds. Repeat __________ times. Complete this stretch __________ times per day. STRETCH - Soleus, Standing  Place hands on wall.  Extend right / left leg, keeping the other knee somewhat bent.  Slightly point your toes inward on your back foot.  Keep your right / left heel on the floor, bend your back knee, and slightly shift your weight over the back leg so that you feel a gentle stretch deep in your back calf.  Hold this position for __________ seconds. Repeat __________ times. Complete this stretch __________ times per day. STRETCH - Gastrocsoleus, Standing  Note: This exercise can place a lot of stress on your foot and ankle. Please complete this exercise only if specifically instructed by your caregiver.   Place the ball of your right / left foot on a step, keeping your other foot firmly on the same step.  Hold on to the wall or a rail for balance.  Slowly lift your other foot, allowing your body weight to press your heel down over the edge of the step.  You should feel a stretch in your right / left calf.  Hold this position for __________ seconds.  Repeat this exercise with a slight bend in your right / left knee. Repeat __________ times. Complete this stretch __________ times per day.    STRENGTHENING EXERCISES - Plantar Fasciitis (Heel Spur Syndrome)  These exercises may help you when beginning to rehabilitate your injury. They may resolve your symptoms with or without further involvement from your physician, physical therapist or athletic trainer. While completing these exercises, remember:   Muscles can gain both the endurance and the strength needed for everyday activities through controlled exercises.  Complete these exercises as instructed by your physician, physical therapist or athletic trainer. Progress the resistance and repetitions only as guided. STRENGTH -  Towel Curls  Sit in a chair positioned on a non-carpeted surface.  Place your foot on a towel, keeping your heel on the floor.  Pull the towel toward your heel by only curling your toes. Keep your heel on the floor.  If instructed by your physician, physical therapist or athletic trainer, add ____________________ at the end of the towel. Repeat __________ times. Complete this exercise __________ times per day. STRENGTH - Ankle Inversion  Secure one end of a rubber exercise band/tubing to a fixed object (table, pole). Loop the other end around your foot just before your toes.  Place your fists between your knees. This will focus your strengthening at your ankle.  Slowly, pull your big toe up and in, making sure the band/tubing is positioned to resist the entire motion.  Hold this position for __________ seconds.  Have your muscles resist the band/tubing as it slowly pulls your foot back to the starting position. Repeat __________ times. Complete this exercises __________ times per day.  Document Released: 01/30/2005 Document Revised: 04/24/2011 Document Reviewed: 05/14/2008 [employer] Patient Information 2015 [employer]. This information is not intended to replace advice given to you by your health care provider. Make sure you discuss any questions you have with your health care provider.  

## 2014-09-15 ENCOUNTER — Encounter: Payer: Self-pay | Admitting: Podiatry

## 2014-09-15 NOTE — Progress Notes (Signed)
Patient ID: Paul Sloan, male   DOB: 01/20/1961, 53 y.o.   MRN: [id number]  Subjective: 54 year-old male presents the also for follow-up evaluation of right heel pain. He states that he is scheduled to the meloxicam intermittently and he finished the Medrol Dosepak without any side effects. He continues to get some mild intermittent discomfort to the bottom his right heel although it is significantly improved compared to what it was previously. He denies any numbness or tingling. Denies any swelling or redness. The pain does not wake him up at night. He has no pain with ambulation. No other complaints this time in no acute changes since last appointment.  Objective: AAO 3, NAD Neurovascular status intact. There is slight tenderness palpation overlying the plantar medial tubercle of the calcaneus of the right heel the insertion of the plantar fascia. There is no pain along the course of plantar fascial in the arch of the foot. No pain on the course the Achilles tendon in the top and has is negative. No pain with lateral compression of the calcaneus or pain the vibratory sensation. No other areas of tenderness to bilateral lower extremities. No overlying edema, erythema, increase in warmth bilaterally. No open lesions or pre-ulcer lesions identified bilaterally. No pain with calf compression, swelling, warmth, erythema.  Assessment: 54 year old male with resolving right heel pain  Plan: -Treatment options discussed including all alternatives, risks, and complications -The MRI findings were mostly negative for plantar fasciitis that he believes that some of his symptoms or stability was. Continue with icing, stretching exercises on a daily basis. Also dispensed plantar fascial brace. Discussed shoe gear modifications and possible orthotics. -Follow-up in 4 weeks if symptoms not completely resolved or sooner if any problems arise. In the meantime, encouraged to call the office with any  questions, concerns, change in symptoms.   Celesta Gentile, DPM

## 2018-02-08 DIAGNOSIS — R05 Cough: Secondary | ICD-10-CM | POA: Diagnosis not present

## 2018-02-08 DIAGNOSIS — J111 Influenza due to unidentified influenza virus with other respiratory manifestations: Secondary | ICD-10-CM | POA: Diagnosis not present

## 2018-04-18 DIAGNOSIS — Z0000 Encounter for general adult medical examination without abnormal findings: Secondary | ICD-10-CM | POA: Diagnosis not present

## 2018-04-18 DIAGNOSIS — E669 Obesity, unspecified: Secondary | ICD-10-CM | POA: Diagnosis not present

## 2018-04-18 DIAGNOSIS — D126 Benign neoplasm of colon, unspecified: Secondary | ICD-10-CM | POA: Diagnosis not present

## 2018-04-18 DIAGNOSIS — Z23 Encounter for immunization: Secondary | ICD-10-CM | POA: Diagnosis not present

## 2018-04-19 DIAGNOSIS — Z1159 Encounter for screening for other viral diseases: Secondary | ICD-10-CM | POA: Diagnosis not present

## 2018-04-19 DIAGNOSIS — E781 Pure hyperglyceridemia: Secondary | ICD-10-CM | POA: Diagnosis not present

## 2018-04-19 DIAGNOSIS — Z125 Encounter for screening for malignant neoplasm of prostate: Secondary | ICD-10-CM | POA: Diagnosis not present

## 2019-07-05 ENCOUNTER — Ambulatory Visit: Payer: BLUE CROSS/BLUE SHIELD | Attending: Internal Medicine

## 2019-07-05 DIAGNOSIS — Z23 Encounter for immunization: Secondary | ICD-10-CM

## 2019-07-05 NOTE — Progress Notes (Signed)
   Covid-19 Vaccination Clinic  Name:  Paul Sloan    MRN: [id number] DOB: [birth year]  07/05/2019  Mr. Paul Sloan was observed post Covid-19 immunization for 15 minutes without incident. He was provided with Vaccine Information Sheet and instruction to access the V-Safe system.   Mr. Paul Sloan was instructed to call 911 with any severe reactions post vaccine: Marland Kitchen Difficulty breathing  . Swelling of face and throat  . A fast heartbeat  . A bad rash all over body  . Dizziness and weakness   Immunizations Administered    Name Date Dose VIS Date Route   Pfizer COVID-19 Vaccine 07/05/2019  9:20 AM 0.3 mL 04/09/2018 Intramuscular   Manufacturer: [employer]   Lot: [id number]   [place]: [id number]

## 2019-07-28 ENCOUNTER — Ambulatory Visit: Payer: BLUE CROSS/BLUE SHIELD | Attending: Internal Medicine

## 2019-07-28 DIAGNOSIS — Z23 Encounter for immunization: Secondary | ICD-10-CM

## 2019-07-28 NOTE — Progress Notes (Signed)
° °  Covid-19 Vaccination Clinic  Name:  Paul Sloan    MRN: [id number] DOB: February 05, 1961  07/28/2019  [name] was observed post Covid-19 immunization for 15 minutes without incident. He was provided with Vaccine Information Sheet and instruction to access the V-Safe system.   [name] was instructed to call 911 with any severe reactions post vaccine:  Difficulty breathing   Swelling of face and throat   A fast heartbeat   A bad rash all over body   Dizziness and weakness   Immunizations Administered    Name Date Dose VIS Date Route   Pfizer COVID-19 Vaccine 07/28/2019 10:13 AM 0.3 mL 04/09/2018 Intramuscular   Manufacturer: [place]   Lot: [id number]   [place]: [id number]

## 2020-07-07 ENCOUNTER — Ambulatory Visit (INDEPENDENT_AMBULATORY_CARE_PROVIDER_SITE_OTHER): Payer: 59 | Admitting: Dermatology

## 2020-07-07 ENCOUNTER — Other Ambulatory Visit: Payer: Self-pay

## 2020-07-07 DIAGNOSIS — Z1283 Encounter for screening for malignant neoplasm of skin: Secondary | ICD-10-CM | POA: Diagnosis not present

## 2020-07-07 DIAGNOSIS — L738 Other specified follicular disorders: Secondary | ICD-10-CM

## 2020-07-07 DIAGNOSIS — L821 Other seborrheic keratosis: Secondary | ICD-10-CM

## 2020-07-07 DIAGNOSIS — A630 Anogenital (venereal) warts: Secondary | ICD-10-CM

## 2020-07-21 ENCOUNTER — Encounter: Payer: Self-pay | Admitting: Dermatology

## 2020-07-21 NOTE — Progress Notes (Signed)
   New Patient   Subjective  Paul Sloan is a 60 y.o. male who presents for the following: Annual Exam (PCP said patient should come in and get a annual skin exam. Patient has no history of skin cancers. ).  General skin examination, has noted bump on penis. Location:  Duration:  Quality:  Associated Signs/Symptoms: Modifying Factors:  Severity:  Timing: Context:    The following portions of the chart were reviewed this encounter and updated as appropriate:  Tobacco  Allergies  Meds  Problems  Med Hx  Surg Hx  Fam Hx      Objective  Well appearing patient in no apparent distress; mood and affect are within normal limits. Objective  Mid Back: Brown flattopped textured 5 mm papule  Objective  Chest - Medial [facility]): Full body skin exam. No atypical moles or non mole skin cancers.   Objective  Ventral Penile Shaft: Four millimeter slightly elevated subtly verrucous flattopped papule    A full examination was performed including scalp, head, eyes, ears, nose, lips, neck, chest, axillae, abdomen, back, buttocks, bilateral upper extremities, bilateral lower extremities, hands, feet, fingers, toes, fingernails, and toenails. All findings within normal limits unless otherwise noted below.   Assessment & Plan  Seborrheic keratosis Mid Back  Leave if stable  Sebaceous hyperplasia of face Head - Anterior (Face)  Screening for malignant neoplasm of skin Chest - Medial (Center)  Yearly skin exam  Condyloma Ventral Penile Shaft  Patient can schedule 30 min surgery for removal.

## 2020-09-30 ENCOUNTER — Other Ambulatory Visit: Payer: Self-pay

## 2020-09-30 ENCOUNTER — Ambulatory Visit (INDEPENDENT_AMBULATORY_CARE_PROVIDER_SITE_OTHER): Payer: 59 | Admitting: Dermatology

## 2020-09-30 DIAGNOSIS — D485 Neoplasm of uncertain behavior of skin: Secondary | ICD-10-CM

## 2020-09-30 NOTE — Progress Notes (Signed)
Dr Denna Haggard roomed patient by himself and locked the door.

## 2020-09-30 NOTE — Patient Instructions (Signed)

## 2020-10-12 ENCOUNTER — Encounter: Payer: Self-pay | Admitting: Dermatology

## 2020-10-13 NOTE — Progress Notes (Signed)
   Follow-Up Visit   Subjective  Paul Sloan is a 60 y.o. male who presents for the following: No chief complaint on file..  Growth on penis to biopsy Location:  Duration:  Quality:  Associated Signs/Symptoms: Modifying Factors:  Severity:  Timing: Context:   Objective  Well appearing patient in no apparent distress; mood and affect are within normal limits. Dorsal Penile Shaft 4 mm slightly verrucous pink-tan keratotic papule, SK versus wart.    A focused examination was performed including genital area. Relevant physical exam findings are noted in the Assessment and Plan.   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  Skin / nail biopsy Type of biopsy: tangential   Informed consent: discussed and consent obtained   Timeout: patient name, date of birth, surgical site, and procedure verified   Procedure prep:  Patient was prepped and draped in usual sterile fashion (Non sterile) Prep type:  Chlorhexidine Anesthesia: the lesion was anesthetized in a standard fashion   Anesthetic:  1% lidocaine w/ epinephrine 1-100,000 local infiltration Instrument used: flexible razor blade   Outcome: patient tolerated procedure well   Post-procedure details: wound care instructions given    Specimen 1 - Surgical pathology Differential Diagnosis: r/o wart  Check Margins: No      I, Lavonna Monarch, MD, have reviewed all documentation for this visit.  The documentation on 10/13/20 for the exam, diagnosis, procedures, and orders are all accurate and complete.

## 2022-06-19 ENCOUNTER — Ambulatory Visit: Payer: 59 | Admitting: Cardiology

## 2022-06-19 ENCOUNTER — Encounter: Payer: Self-pay | Admitting: Cardiology

## 2022-06-19 VITALS — BP 139/84 | HR 60 | Resp 16 | Ht 74.0 in | Wt 255.0 lb

## 2022-06-19 DIAGNOSIS — I480 Paroxysmal atrial fibrillation: Secondary | ICD-10-CM

## 2022-06-19 NOTE — Progress Notes (Signed)
Patient referred by Paul Funk, MD for atrial fibrillation  Subjective:   Paul Sloan, male    DOB: 04-30-1960, 62 y.o.   MRN: [id number]   Chief Complaint  Patient presents with   Atrial Fibrillation   New Patient (Initial Visit)          HPI  62 y.o. Caucasian male with paroxysmal atrial fibrillation  Patient has no prior significant medical history.  He does have significant family history of A-fib and pacemaker.  Patient recently had few symptoms of palpitations and fatigue and was noted to have irregular heartbeat.  Zio monitor placed by PCP showed less than 1% A-fib, details below.  At this time, patient has no symptoms on a regular basis.  He was started on metoprolol tartrate 25 mg twice daily.  Patient does not bring high amount of caffeine or alcohol.  He has only occasional snoring.   Past Medical History:  Diagnosis Date   Diverticulitis of colon with perforation    Sinusitis      Past Surgical History:  Procedure Laterality Date   APPENDECTOMY     diverticulium surgery  06/19/2011   INGUINAL HERNIA REPAIR  20 years ago   bilateral   LAPAROSCOPY  06/18/2011   Procedure: LAPAROSCOPY DIAGNOSTIC;  Surgeon: Adolph Pollack, MD;  Location: [facility];  Service: General;  Laterality: N/A;  Laparoscopic irrigation of abdominal cavity with appendectomy   PARTIAL COLECTOMY  11/07/11   laproscopic   RHINOPLASTY     VASECTOMY  2001     Social History   Tobacco Use  Smoking Status Never  Smokeless Tobacco Never    Social History   Substance and Sexual Activity  Alcohol Use Yes   Comment: social     Family History  Problem Relation Age of Onset   Heart disease Mother    Atrial fibrillation Father    Atrial fibrillation Brother    Hypertension Brother    Heart disease Paternal Grandmother    Cancer Paternal Grandfather        lung      Current Outpatient Medications:    ibuprofen (ADVIL,MOTRIN) 200 MG tablet, Take 200 mg by mouth every  8 (eight) hours as needed. For pain., Disp: , Rfl:    metoprolol tartrate (LOPRESSOR) 25 MG tablet, Take 25 mg by mouth 2 (two) times daily., Disp: , Rfl:    Multiple Vitamin (MULTIVITAMIN) tablet, Take 1 tablet by mouth daily., Disp: , Rfl:    sildenafil (VIAGRA) 50 MG tablet, Take 50 mg by mouth daily as needed for erectile dysfunction., Disp: , Rfl:    Cardiovascular and other pertinent studies:  Reviewed external labs and tests, independently interpreted  EKG 06/19/2022: Sinus rhythm 59 bpm Normal EKG  Mobile cardiac telemetry 9 days 05/18/2022 - 05/27/2022: Dominant rhythm: Sinus. HR 48-141 bpm. Avg HR 77 bpm, in sinus rhythm. Atrial fibrillation occurred (<1% burden), rate 82-198 bpm, avg 125 bpm, longest episode 7 min 37 sec. 0 episodes of SVT. 1.1% isolated SVE, <1% couplet/triplets. 1 episodes of VT, at 150 bpm for 7 beats at 3:19 AM <1% isolated VE, no couplet/triplets. No atrial atrial flutter/SVT/high grade AV block, sinus pause >3sec noted. 0 patient triggered events.     Recent labs: March-April 2024: Glucose 96, BUN/Cr 10/0.9. EGFR 86. K 4.6. H/H 15/45. MCV 94. Platelets 243 HbA1C NA Chol 197, TG 220, HDL 48, LDL 111 TSH 1.2 normal   Review of Systems  Cardiovascular:  Negative for chest pain,  dyspnea on exertion, leg swelling, palpitations and syncope.         Vitals:   06/19/22 1340  BP: 139/84  Pulse: 60  Resp: 16  SpO2: 98%     Body mass index is 32.74 kg/m. Filed Weights   06/19/22 1340  Weight: 255 lb (115.7 kg)     Objective:   Physical Exam Vitals and nursing note reviewed.  Constitutional:      General: He is not in acute distress. Neck:     Vascular: No JVD.  Cardiovascular:     Rate and Rhythm: Normal rate and regular rhythm.     Heart sounds: Normal heart sounds. No murmur heard. Pulmonary:     Effort: Pulmonary effort is normal.     Breath sounds: Normal breath sounds. No wheezing or rales.  Musculoskeletal:     Right  lower leg: No edema.     Left lower leg: No edema.           Visit diagnoses:   ICD-10-CM   1. Atrial fibrillation, chronic (HCC)  I48.20     2. Atrial fibrillation, unspecified type (HCC)  I48.91 EKG 12-Lead       Orders Placed This Encounter  Procedures   EKG 12-Lead        Assessment & Recommendations:   62 y.o. Caucasian male with paroxysmal atrial fibrillation  Paroxysmal atrial fibrillation: <1% A-fib noted on Zio patch, along with 1 nocturnal episode of 7 beat NSVT. Although he does not have overt signs symptoms of obstructive sleep apnea, I remain suspicious of the same.  Recommend sleep study. Given low A-fib burden and minimal symptoms, rate control strategy is perfectly reasonable at this time. Continue metoprolol tartrate milligram twice daily. CHA2DS2-VASc score 0, which will be 1 in February 2025.  Even with this, his annual stroke risk is <1%.  Reasonable to omit anticoagulation at this time. Recommend echocardiogram and exercise treadmill stress test to evaluate for any exertional A-fib.  Further recommendations after above testing.    Thank you for referring the patient to [place]. Please feel free to contact with any questions.   [name], MD Pager: [phone] Office: [phone]

## 2022-07-07 ENCOUNTER — Ambulatory Visit: Payer: 59

## 2022-07-07 DIAGNOSIS — I480 Paroxysmal atrial fibrillation: Secondary | ICD-10-CM

## 2022-07-11 NOTE — Progress Notes (Signed)
Called patient to inform him about his stress test results. Patient understood.

## 2022-07-20 ENCOUNTER — Ambulatory Visit: Payer: 59

## 2022-07-20 DIAGNOSIS — I480 Paroxysmal atrial fibrillation: Secondary | ICD-10-CM

## 2022-07-31 NOTE — Progress Notes (Signed)
Pt aware.

## 2022-09-20 ENCOUNTER — Encounter: Payer: Self-pay | Admitting: Cardiology

## 2022-09-20 ENCOUNTER — Ambulatory Visit: Payer: 59 | Admitting: Cardiology

## 2022-09-20 VITALS — BP 123/78 | HR 70 | Resp 16 | Ht 74.0 in | Wt 253.2 lb

## 2022-09-20 DIAGNOSIS — I480 Paroxysmal atrial fibrillation: Secondary | ICD-10-CM

## 2022-09-20 NOTE — Progress Notes (Signed)
Patient referred by [name], MD for atrial fibrillation  Subjective:   Paul Sloan, male    DOB: 1960/10/03, 62 y.o.   MRN: [id number]   Chief Complaint  Patient presents with   PAF (paroxysmal atrial fibrillation) (HCC)     HPI  62 y.o. Caucasian male with paroxysmal atrial fibrillation  Patient is doing well.  He has not had any recent palpitation, chest pain symptoms.Reviewed recent test results with the patient, details below.    Initial consultation visit 06/2022: Patient has no prior significant medical history.  He does have significant family history of A-fib and pacemaker.  Patient recently had few symptoms of palpitations and fatigue and was noted to have irregular heartbeat.  Zio monitor placed by PCP showed less than 1% A-fib, details below.  At this time, patient has no symptoms on a regular basis.  He was started on metoprolol tartrate 25 mg twice daily.  Patient does not bring high amount of caffeine or alcohol.  He has only occasional snoring.   Current Outpatient Medications:    ibuprofen (ADVIL,MOTRIN) 200 MG tablet, Take 200 mg by mouth every 8 (eight) hours as needed. For pain., Disp: , Rfl:    metoprolol tartrate (LOPRESSOR) 25 MG tablet, Take 25 mg by mouth 2 (two) times daily., Disp: , Rfl:    Multiple Vitamin (MULTIVITAMIN) tablet, Take 1 tablet by mouth daily., Disp: , Rfl:    sildenafil (VIAGRA) 50 MG tablet, Take 50 mg by mouth daily as needed for erectile dysfunction., Disp: , Rfl:    Cardiovascular and other pertinent studies:  Reviewed external labs and tests, independently interpreted  EKG 09/20/2022: Sinus rhythm 70 bpm Normal EKG  Echocardiogram 07/25/2022:  Left ventricle cavity is mildly dilated. Normal left ventricular wall  thickness. Normal global wall motion. Normal LV systolic function with EF  62%. Normal diastolic filling pattern.  Mild tricuspid regurgitation.  No evidence of pulmonary hypertension.   Exercise  treadmill stress test 07/10/2022: Exercise treadmill stress test performed using Bruce protocol.  Patient exercised for 7 minutes and 24 seconds, achieving 9.2 METS, and 87% of age predicted maximum heart rate.  Exercise capacity was good.  No chest pain reported.  Normal heart rate and hemodynamic response. Stress EKG revealed no ischemic changes. Low risk study.    Mobile cardiac telemetry 9 days 05/18/2022 - 05/27/2022: Dominant rhythm: Sinus. HR 48-141 bpm. Avg HR 77 bpm, in sinus rhythm. Atrial fibrillation occurred (<1% burden), rate 82-198 bpm, avg 125 bpm, longest episode 7 min 37 sec. 0 episodes of SVT. 1.1% isolated SVE, <1% couplet/triplets. 1 episodes of VT, at 150 bpm for 7 beats at 3:19 AM <1% isolated VE, no couplet/triplets. No atrial atrial flutter/SVT/high grade AV block, sinus pause >3sec noted. 0 patient triggered events.     Recent labs: March-April 2024: Glucose 96, BUN/Cr 10/0.9. EGFR 86. K 4.6. H/H 15/45. MCV 94. Platelets 243 HbA1C NA Chol 197, TG 220, HDL 48, LDL 111 TSH 1.2 normal   Review of Systems  Cardiovascular:  Negative for chest pain, dyspnea on exertion, leg swelling, palpitations and syncope.         Vitals:   09/20/22 1358  BP: 123/78  Pulse: 70  Resp: 16  SpO2: 96%      Body mass index is 32.51 kg/m. Filed Weights   09/20/22 1358  Weight: 253 lb 3.2 oz (114.9 kg)      Objective:   Physical Exam Vitals and nursing note reviewed.  Constitutional:  General: He is not in acute distress. Neck:     Vascular: No JVD.  Cardiovascular:     Rate and Rhythm: Normal rate and regular rhythm.     Heart sounds: Normal heart sounds. No murmur heard. Pulmonary:     Effort: Pulmonary effort is normal.     Breath sounds: Normal breath sounds. No wheezing or rales.  Musculoskeletal:     Right lower leg: No edema.     Left lower leg: No edema.           Visit diagnoses: No diagnosis found.    No orders of the defined  types were placed in this encounter.       Assessment & Recommendations:   62 y.o. Caucasian male with paroxysmal atrial fibrillation  Paroxysmal atrial fibrillation: <1% A-fib noted on Zio patch, along with 1 nocturnal episode of 7 beat NSVT. Structurally normal heart, no ischemia on stress testing (08/2022). Although he does not have overt signs symptoms of obstructive sleep apnea, I remain suspicious of the same.  Sleep study is pending.  Given low A-fib burden and minimal symptoms, rate control strategy is perfectly reasonable at this time. Continue metoprolol tartrate milligram twice daily. CHA2DS2-VASc score 0, which will be 1 in February 2025.  Even with this, his annual stroke risk is <1%.  Reasonable to omit anticoagulation at this time.  F/u in 1 year    [name], MD Pager: [phone] Office: [phone]

## 2023-09-20 ENCOUNTER — Ambulatory Visit: Payer: Self-pay | Admitting: Cardiology

## 2023-09-24 ENCOUNTER — Ambulatory Visit (INDEPENDENT_AMBULATORY_CARE_PROVIDER_SITE_OTHER)

## 2023-09-24 ENCOUNTER — Ambulatory Visit: Payer: Self-pay | Attending: Cardiology | Admitting: Cardiology

## 2023-09-24 ENCOUNTER — Encounter: Payer: Self-pay | Admitting: Cardiology

## 2023-09-24 ENCOUNTER — Other Ambulatory Visit (HOSPITAL_COMMUNITY): Payer: Self-pay

## 2023-09-24 VITALS — BP 109/70 | HR 85 | Ht 74.0 in | Wt 253.0 lb

## 2023-09-24 DIAGNOSIS — I480 Paroxysmal atrial fibrillation: Secondary | ICD-10-CM | POA: Diagnosis not present

## 2023-09-24 DIAGNOSIS — E782 Mixed hyperlipidemia: Secondary | ICD-10-CM | POA: Diagnosis not present

## 2023-09-24 MED ORDER — METOPROLOL TARTRATE 25 MG PO TABS
25.0000 mg | ORAL_TABLET | Freq: Two times a day (BID) | ORAL | 3 refills | Status: AC
  Filled 2023-09-24: qty 180, 90d supply, fill #0

## 2023-09-24 MED ORDER — APIXABAN 5 MG PO TABS
5.0000 mg | ORAL_TABLET | Freq: Two times a day (BID) | ORAL | 3 refills | Status: DC
  Filled 2023-09-24: qty 60, 30d supply, fill #0

## 2023-09-24 NOTE — Patient Instructions (Signed)
 Medication Instructions:  Start Eliquis  5 mg twice daily   *If you need a refill on your cardiac medications before your next appointment, please call your pharmacy*   Testing/Procedures: 2 week zio   Your physician has requested that you wear a Zio heart monitor for __14___ days. This will be mailed to your home with instructions on how to apply the monitor and how to return it when finished. Please allow 2 weeks after returning the heart monitor before our office calls you with the results.  Echo   Your physician has requested that you have an echocardiogram. Echocardiography is a painless test that uses sound waves to create images of your heart. It provides your doctor with information about the size and shape of your heart and how well your heart's chambers and valves are working. This procedure takes approximately one hour. There are no restrictions for this procedure. Please do NOT wear cologne, perfume, aftershave, or lotions (deodorant is allowed). Please arrive 15 minutes prior to your appointment time.  Please note: We ask at that you not bring children with you during ultrasound (echo/ vascular) testing. Due to room size and safety concerns, children are not allowed in the ultrasound rooms during exams. Our front office staff cannot provide observation of children in our lobby area while testing is being conducted. An adult accompanying a patient to their appointment will only be allowed in the ultrasound room at the discretion of the ultrasound technician under special circumstances. We apologize for any inconvenience.   Calcium Score   CT scanning for a cardiac calcium score (CAT scanning), is a noninvasive, special x-ray that produces cross-sectional images of the body using x-rays and a computer. CT scans help physicians diagnose and treat medical conditions. For some CT exams, a contrast material is used to enhance visibility in the area of the body being studied. CT scans  provide greater clarity and reveal more details than regular x-ray exams.This is not covered by insurance and will be an out-of-pocket cost of approximately $99.     Follow-Up: At [facility], you and your health needs are our priority.  As part of our continuing mission to provide you with exceptional heart care, our providers are all part of one team.  This team includes your primary Cardiologist (physician) and Advanced Practice Providers or APPs (Physician Assistants and Nurse Practitioners) who all work together to provide you with the care you need, when you need it.  Your next appointment:   6 week(s)  Provider:   Newman JINNY Lawrence, MD    We recommend signing up for the patient portal called MyChart.  Sign up information is provided on this After Visit Summary.  MyChart is used to connect with patients for Virtual Visits (Telemedicine).  Patients are able to view lab/test results, encounter notes, upcoming appointments, etc.  Non-urgent messages can be sent to your provider as well.   To learn more about what you can do with MyChart, go to [email or web address].   Other Instructions ZIO XT- Long Term Monitor Instructions  Your physician has requested you wear a ZIO patch monitor for __14__ days.   This is a single patch monitor. [employer] supplies one patch monitor per enrollment. Additional  stickers are not available. Please do not apply patch if you will be having a Nuclear Stress Test,  Echocardiogram, Cardiac CT, MRI, or Chest Xray during the period you would be wearing the  monitor. The patch cannot be worn during these tests. You cannot  remove and re-apply the  ZIO XT patch monitor.   Your ZIO patch monitor will be mailed 3 day USPS to your address on file. It may take 3-5 days  to receive your monitor after you have been enrolled.   Once you have received your monitor, please review the enclosed instructions. Your monitor  has already been registered  assigning a specific monitor serial # to you.     Billing and Patient Assistance Program Information  We have supplied [employer] with any of your insurance information on file for billing purposes.  [employer] offers a sliding scale Patient Assistance Program for patients that do not have  insurance, or whose insurance does not completely cover the cost of the ZIO monitor.  You must apply for the Patient Assistance Program to qualify for this discounted rate.   To apply, please call [employer] at [phone], select option 4, select option 2, ask to apply for  Patient Assistance Program. Meredeth will ask your household income, and how many people  are in your household. They will quote your out-of-pocket cost based on that information.  [employer] will also be able to set up a 59-month, interest-free payment plan if needed.     Applying the monitor  Shave hair from upper left chest.  Hold abrader disc by orange tab. Rub abrader in 40 strokes over the upper left chest as  indicated in your monitor instructions.  Clean area with 4 enclosed alcohol pads. Let dry.  Apply patch as indicated in monitor instructions. Patch will be placed under collarbone on left  side of chest with arrow pointing upward.  Rub patch adhesive wings for 2 minutes. Remove white label marked 1. Remove the white  label marked 2. Rub patch adhesive wings for 2 additional minutes.  While looking in a mirror, press and release button in center of patch. A small green light will  flash 3-4 times. This will be your only indicator that the monitor has been turned on.  Do not shower for the first 24 hours. You may shower after the first 24 hours.  Press the button if you feel a symptom. You will hear a small click. Record Date, Time and  Symptom in the Patient Logbook.  When you are ready to remove the patch, follow instructions on the last 2 pages of Patient  Logbook. Stick patch monitor onto the last page of Patient Logbook.    Place Patient Logbook in the blue and white box. Use locking tab on box and tape box closed  securely. The blue and white box has prepaid postage on it. Please place it in the mailbox as  soon as possible. Your physician should have your test results approximately 7 days after the  monitor has been mailed back to [facility].   Call [facility] Customer Care at [phone] if you have questions regarding  your ZIO XT patch monitor. Call them immediately if you see an orange light blinking on your  monitor.   If your monitor falls off in less than 4 days, contact our Monitor department at [phone].   If your monitor becomes loose or falls off after 4 days call [employer] at [phone] for  suggestions on securing your monitor.

## 2023-09-24 NOTE — Progress Notes (Unsigned)
 Applied a 14 day Zio XT monitor to patient in the office ?

## 2023-09-24 NOTE — Progress Notes (Signed)
 Cardiology Office Note:  .   Date:  09/24/2023  ID:  [name], DOB 31-Mar-1960, MRN [id number] PCP: Charlott Dorn LABOR, MD  [facility] Providers Cardiologist:  Newman Lawrence, MD PCP: Charlott Dorn LABOR, MD  Chief Complaint  Patient presents with   PAF (paroxysmal Atrial Fibrillation)     Paul Sloan is a 63 y.o. male with hyperlipidemia, paroxysmal atrial fibrillation   History of Present Illness  Patient reports palpitations and tachycardia symptoms at times.  He denies any chest pain or shortness of breath.   Vitals:   09/24/23 0945  BP: 109/70  Pulse: 85  SpO2: 95%      Review of Systems  Cardiovascular:  Negative for chest pain, dyspnea on exertion, leg swelling, palpitations and syncope.        Studies Reviewed: SABRA   EKG Interpretation Date/Time:  Monday September 24 2023 09:48:38 EDT Ventricular Rate:  85 PR Interval:    QRS Duration:  84 QT Interval:  366 QTC Calculation: 435 R Axis:   48  Text Interpretation: 85 bpm EKG 09/24/2023: Atrial fibrillation Confirmed by Lawrence, Nickalos Petersen ([id number]) on 09/24/2023 9:49:51 AM    85 bpm EKG 09/24/2023: Atrial fibrillation  Labs 04/2023: Chol 196, TG 196, HDL 48, LDL 115 Hb 16.5   Mobile cardiac telemetry 9 days 05/18/2022 - 05/27/2022: Dominant rhythm: Sinus. HR 48-141 bpm. Avg HR 77 bpm, in sinus rhythm. Atrial fibrillation occurred (<1% burden), rate 82-198 bpm, avg 125 bpm, longest episode 7 min 37 sec. 0 episodes of SVT. 1.1% isolated SVE, <1% couplet/triplets. 1 episodes of VT, at 150 bpm for 7 beats at 3:19 AM <1% isolated VE, no couplet/triplets. No atrial atrial flutter/SVT/high grade AV block, sinus pause >3sec noted. 0 patient triggered events.   Echocardiogram 07/2022: Left ventricle cavity is mildly dilated. Normal left ventricular wall thickness. Normal global wall motion. Normal LV systolic function with EF 62%. Normal diastolic filling pattern. Mild  tricuspid regurgitation. No evidence of pulmonary hypertension.  Exercise treadmill stress test 06/2022: Exercise treadmill stress test performed using Bruce protocol.  Patient exercised for 7 minutes and 24 seconds, achieving 9.2 METS, and 87% of age predicted maximum heart rate.  Exercise capacity was good.  No chest pain reported.  Normal heart rate and hemodynamic response. Stress EKG revealed no ischemic changes. Low risk study.     Risk Assessment/Calculations:    CHA2DS2-VASc Score = 2  This indicates a 2.2% annual risk of stroke. The patient's score is based upon: CHF History: 0 HTN History: 1 Diabetes History: 0 Stroke History: 0 Vascular Disease History: 0 Age Score: 1 Gender Score: 0     Physical Exam Vitals and nursing note reviewed.  Constitutional:      General: He is not in acute distress. Neck:     Vascular: No JVD.  Cardiovascular:     Rate and Rhythm: Normal rate. Rhythm irregular.     Heart sounds: Normal heart sounds. No murmur heard. Pulmonary:     Effort: Pulmonary effort is normal.     Breath sounds: Normal breath sounds. No wheezing or rales.  Musculoskeletal:     Right lower leg: No edema.     Left lower leg: No edema.      VISIT DIAGNOSES:   ICD-10-CM   1. PAF (paroxysmal atrial fibrillation) (HCC)  I48.0 EKG 12-Lead    LONG TERM MONITOR (3-14 DAYS)    ECHOCARDIOGRAM COMPLETE    2. Mixed hyperlipidemia  E78.2 CT CARDIAC SCORING (SELF  PAY ONLY)       Paul Sloan is a 63 y.o. male with hyperlipidemia, paroxysmal atrial fibrillation  Assessment & Plan  Paroxysmal atrial fibrillation: Previously noted to have paroxysmal A-fib with <1% A-fib burden in 2024. Patient is in rate controlled A-fib today with no clear symptoms. It is possible that his A-fib burden has progressed since 2024. Recommend echocardiogram and 2-week ZIO monitor to assess A-fib burden. Previously not on anticoagulation due to low stroke risk.  Patient has  at least 1 point his age now, with possibly another point for hypertension (blood pressure is well-controlled on rate control metoprolol ).  There is also possibility of aortic atherosclerosis given his mixed hyperlipidemia. In addition, he may also be in contention for cardioversion should the A-fib be now persistent. Also, stroke risk will only increase as his age increases. Overall, I do think benefits of anticoagulation outweigh risks in absence of any bleeding issues. Started Eliquis  5 mg twice daily. Continue metoprolol  tartrate 25 mg twice daily. Follow-up in 4 to 6 weeks to reassess A-fib burden and consider cardioversion.  Mixed hyperlipidemia: Currently not on statin. Check CT cardiac scoring scan for risk stratification.       Meds ordered this encounter  Medications   apixaban  (ELIQUIS ) 5 MG TABS tablet    Sig: Take 1 tablet (5 mg total) by mouth 2 (two) times daily.    Dispense:  180 tablet    Refill:  3    NEW START   metoprolol  tartrate (LOPRESSOR ) 25 MG tablet    Sig: Take 1 tablet (25 mg total) by mouth 2 (two) times daily.    Dispense:  180 tablet    Refill:  3     F/u in 6 weeks  Signed, Newman JINNY Lawrence, MD

## 2023-09-28 ENCOUNTER — Ambulatory Visit (HOSPITAL_COMMUNITY)

## 2023-10-11 ENCOUNTER — Ambulatory Visit (HOSPITAL_COMMUNITY)
Admission: RE | Admit: 2023-10-11 | Discharge: 2023-10-11 | Disposition: A | Discharge status: Home / Self Care | Payer: Self-pay | Source: Ambulatory Visit | Attending: Cardiology | Admitting: Cardiology

## 2023-10-11 DIAGNOSIS — E782 Mixed hyperlipidemia: Secondary | ICD-10-CM | POA: Insufficient documentation

## 2023-10-12 ENCOUNTER — Ambulatory Visit (HOSPITAL_COMMUNITY)

## 2023-10-12 ENCOUNTER — Ambulatory Visit: Payer: Self-pay | Admitting: Cardiology

## 2023-10-12 DIAGNOSIS — I480 Paroxysmal atrial fibrillation: Secondary | ICD-10-CM | POA: Diagnosis not present

## 2023-10-12 NOTE — Progress Notes (Signed)
 10% A-fib with controlled ventricular rate, no reported associated symptoms. Continue Eliquis . I do not think cardioversion would help given that A-fib is paroxysmal. Continue current medications.  Will discuss more regarding symptoms at next office visit  Thanks MJP

## 2023-10-12 NOTE — Progress Notes (Signed)
 Mild coronary calcification noted.  Given elevated LDL, in addition to heart healthy diet and exercise, recommend Crestor 20 mg daily.  If patient not keen on starting statin, I will discuss further at next office visit in September.  Thanks MJP

## 2023-10-29 ENCOUNTER — Ambulatory Visit (HOSPITAL_COMMUNITY)

## 2023-11-08 ENCOUNTER — Ambulatory Visit (HOSPITAL_COMMUNITY)
Admission: RE | Admit: 2023-11-08 | Discharge: 2023-11-08 | Disposition: A | Discharge status: Home / Self Care | Source: Ambulatory Visit | Attending: Cardiology | Admitting: Cardiology

## 2023-11-08 DIAGNOSIS — I480 Paroxysmal atrial fibrillation: Secondary | ICD-10-CM | POA: Insufficient documentation

## 2023-11-08 LAB — ECHOCARDIOGRAM COMPLETE: S' Lateral: 3.4 cm

## 2023-11-09 NOTE — Progress Notes (Signed)
 Cardiology Office Note:  .   Date:  11/13/2023  ID:  Paul Sloan, DOB Jun 04, 1960, MRN [id number] PCP: Charlott Paul LABOR, MD  [facility] Providers Cardiologist:  Newman Lawrence, MD PCP: Charlott Paul LABOR, MD  Chief Complaint  Patient presents with   PAF     Paul Sloan is a 63 y.o. male with hyperlipidemia, paroxysmal atrial fibrillation   History of Present Illness  Patient reports palpitations and tachycardia symptoms at times.  He denies any chest pain or shortness of breath.  He was recommended Eliquis  at last visit, but has not started it, concerned about bleeding risk.  He asks if he could take aspirin 81 mg or 325 mg daily.  Separately, reviewed recent echocardiogram and CT cardiac scoring scan results with the patient, due to stroke.   Vitals:   11/13/23 0959  BP: 102/67  Pulse: 70  SpO2: 97%       Review of Systems  Cardiovascular:  Positive for palpitations. Negative for chest pain, dyspnea on exertion, leg swelling and syncope.        Studies Reviewed: .        85 bpm EKG 09/24/2023: Atrial fibrillation  Labs 04/2023: Chol 196, TG 196, HDL 48, LDL 115 Hb 16.5  Echocardiogram 10/2023:  1. Left ventricular ejection fraction, by estimation, is 60 to 65%. The left ventricle has normal function. The left ventricle has no regional wall motion abnormalities. Left ventricular diastolic function could not be evaluated.  2. Right ventricular systolic function is normal. The right ventricular size is normal.  3. The mitral valve is normal in structure. Trivial mitral valve regurgitation. No evidence of mitral stenosis.  4. The aortic valve is tricuspid. There is mild calcification of the aortic valve. Aortic valve regurgitation is not visualized. Aortic valve sclerosis/calcification is present, without any evidence of aortic stenosis.  5. Aortic dilatation noted. There is borderline dilatation of the ascending aorta,  measuring 38 mm.  6. The inferior vena cava is normal in size with greater than 50% respiratory variability, suggesting right atrial pressure of 3 mmHg.   Coronary Calcium Score 09/2023:  Left main: 0  Left anterior descending artery: 52.6 Left circumflex artery: 2.54  Right coronary artery: 1.4   Total: 56.5  Percentile: 46th   Pericardium: Normal. Ascending Aorta: Mildly dilated, 40 mm.  Zio patch monitor 10 days 09/24/2023 - 10/04/2023: Dominant rhythm: Sinus. HR 48-127 bpm. Avg HR 73 bpm, in sinus rhythm. Atrial Fibrillation occurred (10% burden), ranging from 52-178 bpm (avg of 91 bpm), the longest lasting 14 hours 46 mins with an avg  rate of 87 bpm. No atrial flutter/SVT/VT/high grade AV block, sinus pause >3sec noted. <1% SVE/VE. 0 patient triggered events.     Exercise treadmill stress test 06/2022: Exercise treadmill stress test performed using Bruce protocol.  Patient exercised for 7 minutes and 24 seconds, achieving 9.2 METS, and 87% of age predicted maximum heart rate.  Exercise capacity was good.  No chest pain reported.  Normal heart rate and hemodynamic response. Stress EKG revealed no ischemic changes. Low risk study.     Risk Assessment/Calculations:    CHA2DS2-VASc Score = 1  This indicates a 0.6% annual risk of stroke. The patient's score is based upon: CHF History: 0 HTN History: 0 Diabetes History: 0 Stroke History: 0 Vascular Disease History: 0 Age Score: 1 Gender Score: 0     Physical Exam Vitals and nursing note reviewed.  Constitutional:      General:  He is not in acute distress. Neck:     Vascular: No JVD.  Cardiovascular:     Rate and Rhythm: Normal rate. Rhythm irregular.     Heart sounds: Normal heart sounds. No murmur heard. Pulmonary:     Effort: Pulmonary effort is normal.     Breath sounds: Normal breath sounds. No wheezing or rales.  Musculoskeletal:     Right lower leg: No edema.     Left lower leg: No edema.      VISIT  DIAGNOSES:   ICD-10-CM   1. PAF (paroxysmal atrial fibrillation) (HCC)  I48.0     2. Mixed hyperlipidemia  E78.2     3. Elevated coronary artery calcium score  R93.1     4. Ascending aorta dilatation  I77.810         Paul Sloan is a 63 y.o. male with hyperlipidemia, paroxysmal atrial fibrillation, ascending aorta dilatation, coronary artery calcification Assessment & Plan  Paroxysmal atrial fibrillation: 10% Afib burden on monitor 09/2023, up since <1% A-fib burden in 2024. Associated symptoms of palpitations and fatigue. Continue metoprolol  tartrate 25 mg twice daily. CHA2DS2-VASc Score = 1 , annual stroke risk <1%. He has wanted to avoid being on Eliquis .  His stroke risk is low to moderate.  However, if considering ablation, he will definitely need to be on anticoagulation.  For now, he would like to be on aspirin 81 mg daily. Refer to EP for consideration for rhythm control therapy, including ablation.  Mixed hyperlipidemia: CAC 46th percentile. In addition to heart healthy diet and SI, recommend Crestor 10 mg daily. Recheck lipid panel in 3 months.  Ascending aorta dilatation: Borderline, likely normal for his body surface area. No further workup needed at this time.     Meds ordered this encounter  Medications   aspirin EC 81 MG tablet    Sig: Take 1 tablet (81 mg total) by mouth daily. Swallow whole.    Dispense:  90 tablet    Refill:  3   rosuvastatin (CRESTOR) 10 MG tablet    Sig: Take 1 tablet (10 mg total) by mouth daily.    Dispense:  90 tablet    Refill:  3     F/u in 6 months  Signed, Newman JINNY Lawrence, MD

## 2023-11-13 ENCOUNTER — Other Ambulatory Visit (HOSPITAL_COMMUNITY): Payer: Self-pay

## 2023-11-13 ENCOUNTER — Encounter: Payer: Self-pay | Admitting: Cardiology

## 2023-11-13 ENCOUNTER — Ambulatory Visit: Attending: Cardiology | Admitting: Cardiology

## 2023-11-13 VITALS — BP 102/67 | HR 70 | Ht 74.0 in | Wt 247.0 lb

## 2023-11-13 DIAGNOSIS — I77810 Thoracic aortic ectasia: Secondary | ICD-10-CM | POA: Diagnosis not present

## 2023-11-13 DIAGNOSIS — R931 Abnormal findings on diagnostic imaging of heart and coronary circulation: Secondary | ICD-10-CM | POA: Diagnosis not present

## 2023-11-13 DIAGNOSIS — I480 Paroxysmal atrial fibrillation: Secondary | ICD-10-CM

## 2023-11-13 DIAGNOSIS — E782 Mixed hyperlipidemia: Secondary | ICD-10-CM | POA: Diagnosis not present

## 2023-11-13 MED ORDER — ROSUVASTATIN CALCIUM 10 MG PO TABS: 10.0000 mg | ORAL_TABLET | Freq: Every day | ORAL | 3 refills | Status: AC

## 2023-11-13 MED ORDER — ASPIRIN 81 MG PO TBEC: 81.0000 mg | DELAYED_RELEASE_TABLET | Freq: Every day | ORAL | 3 refills | Status: DC

## 2023-11-13 NOTE — Patient Instructions (Signed)
 Medication Instructions:  Continue to hold Eliquis   START Aspirin 81 mg daily START Crestor 10 mg daily   *If you need a refill on your cardiac medications before your next appointment, please call your pharmacy*  Lab Work: Lipid panel in 3 months   If you have labs (blood work) drawn today and your tests are completely normal, you will receive your results only by: MyChart Message (if you have MyChart) OR A paper copy in the mail If you have any lab test that is abnormal or we need to change your treatment, we will call you to review the results.  REFERRAL TO ELECTROPHYSIOLOGY    Follow-Up: At [facility], you and your health needs are our priority.  As part of our continuing mission to provide you with exceptional heart care, our providers are all part of one team.  This team includes your primary Cardiologist (physician) and Advanced Practice Providers or APPs (Physician Assistants and Nurse Practitioners) who all work together to provide you with the care you need, when you need it.  Your next appointment:   6 month(s)  Provider:   Newman JINNY Lawrence, MD

## 2023-11-27 ENCOUNTER — Ambulatory Visit: Admitting: Cardiology

## 2023-12-07 ENCOUNTER — Encounter: Payer: Self-pay | Admitting: Cardiovascular Disease

## 2023-12-07 ENCOUNTER — Ambulatory Visit: Attending: Cardiovascular Disease | Admitting: Cardiovascular Disease

## 2023-12-07 VITALS — BP 112/70 | HR 84 | Ht 74.0 in | Wt 242.0 lb

## 2023-12-07 DIAGNOSIS — Z01812 Encounter for preprocedural laboratory examination: Secondary | ICD-10-CM | POA: Diagnosis not present

## 2023-12-07 DIAGNOSIS — I480 Paroxysmal atrial fibrillation: Secondary | ICD-10-CM | POA: Diagnosis not present

## 2023-12-07 MED ORDER — APIXABAN 5 MG PO TABS: 5.0000 mg | ORAL_TABLET | Freq: Two times a day (BID) | ORAL | Status: AC

## 2023-12-07 MED ORDER — DRONEDARONE HCL 400 MG PO TABS: 400.0000 mg | ORAL_TABLET | Freq: Two times a day (BID) | ORAL | 11 refills | Status: AC

## 2023-12-07 NOTE — Patient Instructions (Addendum)
 Medication Instructions:  Your physician has recommended you make the following change in your medication:   ** Stop Aspirin  ** Begin Multaq 400mg  - 1 tablet by mouth twice daily.  ** Restart Eliquis  5mg  - 1 tablet by mouth twice daily  *If you need a refill on your cardiac medications before your next appointment, please call your pharmacy*  Testing/Procedures: Ablation Your physician has recommended that you have an ablation. Catheter ablation is a medical procedure used to treat some cardiac arrhythmias (irregular heartbeats). During catheter ablation, a long, thin, flexible tube is put into a blood vessel in your groin (upper thigh), or neck. This tube is called an ablation catheter. It is then guided to your heart through the blood vessel. Radio frequency waves destroy small areas of heart tissue where abnormal heartbeats may cause an arrhythmia to start.   You are scheduled for Atrial Fibrillation Ablation on Tuesday, December 16 with Dr. Dr. Nancey. Please arrive at the Main Entrance A at [facility]: [address] [place], [place] [address] at 5:30 AM   What To Expect:  Labs: you will need to have lab work drawn within 30 days of your procedure. Please go to any [facility] location to have these drawn - no appointment is needed.  You will receive procedure instructions either through MyChart or in the mail 4-6 week prior to your procedure.  After your procedure we recommend no driving for 4 days, no lifting over 5 lbs for 7 days, and no work or strenuous activity for 7 days.  Please contact our office at [phone] if you have any questions.    Follow-Up: We will contact you to schedule your post-procedure appointments.     Dear Paul Sloan   You are scheduled for a Cardioversion on Thursday, November 20 with Dr. Francyne.  Please arrive at the [facility] (Main Entrance A) at [facility]: [address] [place], [place] [address] at 6:00 AM (This  time is 1.5 hour(s) before your procedure to ensure your preparation).   Free valet parking service is available. You will check in at ADMITTING.   *Please Note: You will receive a call the day before your procedure to confirm the appointment time. That time may have changed from the original time based on the schedule for that day.*   DIET:  Nothing to eat or drink after midnight except a sip of water with medications (see medication instructions below)  MEDICATION INSTRUCTIONS: !!IF ANY NEW MEDICATIONS ARE STARTED AFTER TODAY, PLEASE NOTIFY YOUR PROVIDER AS SOON AS POSSIBLE!!   Continue taking your anticoagulant (blood thinner): Apixaban  (Eliquis ).  You will need to continue this after your procedure until you are told by your provider that it is safe to stop.    LABS:  Your labs will be done at the hospital prior to your procedure - you will need to arrive 1 and 1/2 hours prior to your procedure.  FYI:  For your safety, and to allow us  to monitor your vital signs accurately during the surgery/procedure we request: If you have artificial nails, gel coating, SNS etc, please have those removed prior to your surgery/procedure. Not having the nail coverings /polish removed may result in cancellation or delay of your surgery/procedure.  Your support person will be asked to wait in the waiting room during your procedure.  It is OK to have someone drop you off and come back when you are ready to be discharged.  You cannot drive after  the procedure and will need someone to drive you home.  Bring your insurance cards.  *Special Note: Every effort is made to have your procedure done on time. Occasionally there are emergencies that occur at the hospital that may cause delays. Please be patient if a delay does occur.

## 2023-12-07 NOTE — H&P (View-Only) (Signed)
 Electrophysiology Office Note:    Date:  12/07/2023   ID:  Paul Sloan, DOB [birth year], MRN [id number]  PCP:  Charlott Dorn LABOR, MD   [facility] Providers Cardiologist:  Newman JINNY Lawrence, MD Electrophysiologist:  OLE ONEIDA HOLTS, MD     Referring MD: Lawrence Newman JINNY, MD   History of Present Illness:    Paul Sloan is a 63 y.o. male with a medical history significant for paroxysmal atrial fibrillation, hyperlipidemia referred for arrhythmia management.       History of Present Illness  The patient first saw Dr. Lawrence in May 2024 after experiencing several episodes of palpitations and fatigue and noted to have an irregular heart rate.  ZIO monitor placed by the patient's PCP showed atrial fibrillation, with less than 1% burden of arrhythmia.  He was managed with a rate control approach and continued to have episodes, which increased in duration and frequency.  ZIO monitor placed in August 2025 showed a 10% burden of atrial fibrillation, and the patient notes that he was doing fairly well during that time.  Now, he feels like he has been in atrial fibrillation persistently for the past several weeks.  He notices irregular heartbeats when he feels his pulse and feels fatigued and short of breath in atrial fibrillation.          Today, he is at baseline -with decreased energy levels and atrial fibrillation.  EKGs/Labs/Other Studies Reviewed Today:     Echocardiogram:  September 2025 LVEF 60 to 65%.  Normal valvular structure and function.  Ascending aorta measured 38 mm.   Monitors:  10 day monitor August 2025-- my interpretation Sinus rhythm predominates, heart rate 48 to 107 bpm, average 73 bpm 10% burden of atrial fibrillation, heart rate 52 to 178 bpm, average 91 bpm Ectopy is rare. No symptoms reported  Stress testing:  Exercise treadmill stress May 2024 Patient exercised for 7 minutes 24 seconds, achieving 9.2  METS 87% of age-predicted maximum heart rate and met.  No ischemic changes on ECG  Advanced imaging:  Coronary calcium score Total score 46.5   EKG:   EKG Interpretation Date/Time:  Friday December 07 2023 08:22:07 EDT Ventricular Rate:  84 PR Interval:    QRS Duration:  84 QT Interval:  368 QTC Calculation: 434 R Axis:   56  Text Interpretation: Atrial fibrillation When compared with ECG of 24-Sep-2023 09:48, No significant change was found Confirmed by Nancey Scotts [phone]) on 12/07/2023 8:30:41 AM     Physical Exam:    VS:  BP 112/70 (BP Location: Left Arm, Patient Position: Sitting, Cuff Size: Large)   Pulse 84   Ht 6' 2 (1.88 m)   Wt 242 lb (109.8 kg)   SpO2 99%   BMI 31.07 kg/m     Wt Readings from Last 3 Encounters:  12/07/23 242 lb (109.8 kg)  11/13/23 247 lb (112 kg)  09/24/23 253 lb (114.8 kg)     GEN: Well nourished, well developed in no acute distress CARDIAC: iRRR, no murmurs, rubs, gallops RESPIRATORY:  Normal work of breathing MUSCULOSKELETAL: no edema    ASSESSMENT & PLAN:     Atrial fibrillation Now persistent He is symptomatic with fatigue, mild palpitations We discussed management options.  I explained the indication for and logistics of the ablation procedure.  I explained risks including bruising or bleeding at the access site, vascular injury, stroke, heart attack, need for surgery or prolonged hospital stay, risk of death.  I explained  that the risk of major complications is very low but not zero.  He would like to proceed with scheduling the procedure today. Start dronedarone 400 mg twice daily with meals Start Eliquis  5 mg p.o. twice daily Plan DC cardioversion pending ablation  Secondary hypercoagulable state Discontinue aspirin 81 mg Continue apixaban  5 mg p.o. twice daily  Possible sleep apnea Sleep study is pending   Signed, Eulas FORBES Furbish, MD  12/07/2023 8:31 AM    [facility]

## 2023-12-07 NOTE — Progress Notes (Signed)
 Electrophysiology Office Note:    Date:  12/07/2023   ID:  Paul Sloan, DOB [birth year], MRN [id number]  PCP:  Charlott Dorn LABOR, MD   [facility] Providers Cardiologist:  Newman JINNY Lawrence, MD Electrophysiologist:  OLE ONEIDA HOLTS, MD     Referring MD: Lawrence Newman JINNY, MD   History of Present Illness:    Paul Sloan is a 63 y.o. male with a medical history significant for paroxysmal atrial fibrillation, hyperlipidemia referred for arrhythmia management.       History of Present Illness  The patient first saw Dr. Lawrence in May 2024 after experiencing several episodes of palpitations and fatigue and noted to have an irregular heart rate.  ZIO monitor placed by the patient's PCP showed atrial fibrillation, with less than 1% burden of arrhythmia.  He was managed with a rate control approach and continued to have episodes, which increased in duration and frequency.  ZIO monitor placed in August 2025 showed a 10% burden of atrial fibrillation, and the patient notes that he was doing fairly well during that time.  Now, he feels like he has been in atrial fibrillation persistently for the past several weeks.  He notices irregular heartbeats when he feels his pulse and feels fatigued and short of breath in atrial fibrillation.          Today, he is at baseline -with decreased energy levels and atrial fibrillation.  EKGs/Labs/Other Studies Reviewed Today:     Echocardiogram:  September 2025 LVEF 60 to 65%.  Normal valvular structure and function.  Ascending aorta measured 38 mm.   Monitors:  10 day monitor August 2025-- my interpretation Sinus rhythm predominates, heart rate 48 to 107 bpm, average 73 bpm 10% burden of atrial fibrillation, heart rate 52 to 178 bpm, average 91 bpm Ectopy is rare. No symptoms reported  Stress testing:  Exercise treadmill stress May 2024 Patient exercised for 7 minutes 24 seconds, achieving 9.2  METS 87% of age-predicted maximum heart rate and met.  No ischemic changes on ECG  Advanced imaging:  Coronary calcium score Total score 46.5   EKG:   EKG Interpretation Date/Time:  Friday December 07 2023 08:22:07 EDT Ventricular Rate:  84 PR Interval:    QRS Duration:  84 QT Interval:  368 QTC Calculation: 434 R Axis:   56  Text Interpretation: Atrial fibrillation When compared with ECG of 24-Sep-2023 09:48, No significant change was found Confirmed by Nancey Scotts [phone]) on 12/07/2023 8:30:41 AM     Physical Exam:    VS:  BP 112/70 (BP Location: Left Arm, Patient Position: Sitting, Cuff Size: Large)   Pulse 84   Ht 6' 2 (1.88 m)   Wt 242 lb (109.8 kg)   SpO2 99%   BMI 31.07 kg/m     Wt Readings from Last 3 Encounters:  12/07/23 242 lb (109.8 kg)  11/13/23 247 lb (112 kg)  09/24/23 253 lb (114.8 kg)     GEN: Well nourished, well developed in no acute distress CARDIAC: iRRR, no murmurs, rubs, gallops RESPIRATORY:  Normal work of breathing MUSCULOSKELETAL: no edema    ASSESSMENT & PLAN:     Atrial fibrillation Now persistent He is symptomatic with fatigue, mild palpitations We discussed management options.  I explained the indication for and logistics of the ablation procedure.  I explained risks including bruising or bleeding at the access site, vascular injury, stroke, heart attack, need for surgery or prolonged hospital stay, risk of death.  I explained  that the risk of major complications is very low but not zero.  He would like to proceed with scheduling the procedure today. Start dronedarone 400 mg twice daily with meals Start Eliquis  5 mg p.o. twice daily Plan DC cardioversion pending ablation  Secondary hypercoagulable state Discontinue aspirin 81 mg Continue apixaban  5 mg p.o. twice daily  Possible sleep apnea Sleep study is pending   Signed, Eulas FORBES Furbish, MD  12/07/2023 8:31 AM    [facility]

## 2023-12-12 ENCOUNTER — Encounter: Payer: Self-pay | Admitting: Cardiovascular Disease

## 2024-01-02 NOTE — Anesthesia Preprocedure Evaluation (Signed)
 Anesthesia Evaluation  Patient identified by MRN, date of birth, ID band Patient awake    Reviewed: Allergy & Precautions, NPO status , Patient's Chart, lab work & pertinent test results  History of Anesthesia Complications Negative for: history of anesthetic complications  Airway Mallampati: II  TM Distance: >3 FB Neck ROM: Full    Dental no notable dental hx. (+) Dental Advisory Given   Pulmonary neg pulmonary ROS   Pulmonary exam normal breath sounds clear to auscultation       Cardiovascular Exercise Tolerance: Good (-) angina + CAD  (-) Past MI + dysrhythmias (on apixiban) Atrial Fibrillation  Rhythm:Irregular Rate:Abnormal     Neuro/Psych negative neurological ROS  negative psych ROS   GI/Hepatic ,neg GERD  ,,  Endo/Other  neg diabetes    Renal/GU   negative genitourinary   Musculoskeletal   Abdominal   Peds  Hematology   Anesthesia Other Findings   Reproductive/Obstetrics                              Anesthesia Physical Anesthesia Plan  ASA: 2  Anesthesia Plan: General   Post-op Pain Management: Minimal or no pain anticipated   Induction: Intravenous  PONV Risk Score and Plan: Treatment may vary due to age or medical condition  Airway Management Planned: Mask  Additional Equipment: None  Intra-op Plan:   Post-operative Plan:   Informed Consent: I have reviewed the patients History and Physical, chart, labs and discussed the procedure including the risks, benefits and alternatives for the proposed anesthesia with the patient or authorized representative who has indicated his/her understanding and acceptance.     Dental advisory given  Plan Discussed with: CRNA  Anesthesia Plan Comments:          Anesthesia Quick Evaluation

## 2024-01-03 ENCOUNTER — Other Ambulatory Visit: Payer: Self-pay

## 2024-01-03 ENCOUNTER — Ambulatory Visit (HOSPITAL_COMMUNITY): Payer: Self-pay | Admitting: Anesthesiology

## 2024-01-03 ENCOUNTER — Encounter (HOSPITAL_COMMUNITY): Payer: Self-pay | Admitting: Anesthesiology

## 2024-01-03 ENCOUNTER — Ambulatory Visit (HOSPITAL_COMMUNITY)
Admission: RE | Admit: 2024-01-03 | Discharge: 2024-01-03 | Disposition: A | Discharge status: Home / Self Care | Attending: Cardiovascular Disease | Admitting: Cardiovascular Disease

## 2024-01-03 ENCOUNTER — Encounter (HOSPITAL_COMMUNITY)
Admission: RE | Disposition: A | Discharge status: Home / Self Care | Payer: Self-pay | Source: Home / Self Care | Attending: Cardiovascular Disease

## 2024-01-03 ENCOUNTER — Encounter (HOSPITAL_COMMUNITY): Payer: Self-pay | Admitting: Cardiovascular Disease

## 2024-01-03 DIAGNOSIS — Z7901 Long term (current) use of anticoagulants: Secondary | ICD-10-CM | POA: Diagnosis not present

## 2024-01-03 DIAGNOSIS — I4891 Unspecified atrial fibrillation: Secondary | ICD-10-CM | POA: Diagnosis not present

## 2024-01-03 DIAGNOSIS — Z006 Encounter for examination for normal comparison and control in clinical research program: Secondary | ICD-10-CM

## 2024-01-03 DIAGNOSIS — E785 Hyperlipidemia, unspecified: Secondary | ICD-10-CM | POA: Diagnosis not present

## 2024-01-03 DIAGNOSIS — D6869 Other thrombophilia: Secondary | ICD-10-CM | POA: Diagnosis not present

## 2024-01-03 DIAGNOSIS — I480 Paroxysmal atrial fibrillation: Secondary | ICD-10-CM

## 2024-01-03 DIAGNOSIS — I4819 Other persistent atrial fibrillation: Secondary | ICD-10-CM | POA: Insufficient documentation

## 2024-01-03 DIAGNOSIS — Z79899 Other long term (current) drug therapy: Secondary | ICD-10-CM | POA: Diagnosis not present

## 2024-01-03 HISTORY — PX: CARDIOVERSION: EP1203

## 2024-01-03 LAB — POCT I-STAT, CHEM 8
BUN: 7 mg/dL — ABNORMAL LOW (ref 8–23)
Calcium, Ion: 1.39 mmol/L (ref 1.15–1.40)
Chloride: 104 mmol/L (ref 98–111)
Creatinine, Ser: 1.1 mg/dL (ref 0.61–1.24)
Glucose, Bld: 99 mg/dL (ref 70–99)
HCT: 47 % (ref 39.0–52.0)
Hemoglobin: 16 g/dL (ref 13.0–17.0)
Potassium: 4.1 mmol/L (ref 3.5–5.1)
Sodium: 140 mmol/L (ref 135–145)
TCO2: 23 mmol/L (ref 22–32)

## 2024-01-03 SURGERY — CARDIOVERSION (CATH LAB)
Anesthesia: General

## 2024-01-03 MED ORDER — PROPOFOL 10 MG/ML IV BOLUS
INTRAVENOUS | Status: DC | PRN
  Administered 2024-01-03: 80 mg via INTRAVENOUS

## 2024-01-03 MED ORDER — SODIUM CHLORIDE 0.9 % IV SOLN: INTRAVENOUS | Status: DC

## 2024-01-03 MED ORDER — LIDOCAINE 2% (20 MG/ML) 5 ML SYRINGE
INTRAMUSCULAR | Status: DC | PRN
  Administered 2024-01-03: 80 mg via INTRAVENOUS

## 2024-01-03 SURGICAL SUPPLY — 1 items: PAD DEFIB RADIO PHYSIO CONN (PAD) ×1 IMPLANT

## 2024-01-03 NOTE — Research (Addendum)
 [facility] Cardioversion Informed Consent   Subject Name: Paul Sloan [facility]  Subject met inclusion and exclusion criteria.  The informed consent form, study requirements and expectations were reviewed with the subject and questions and concerns were addressed prior to the signing of the consent form.  The subject verbalized understanding of the trial requirements.  The subject agreed to participate in the [facility] Cardioversion trial and signed the informed consent at 0705 on 20/Nov/2025.  The informed consent was obtained prior to performance of any protocol-specific procedures for the subject.  A copy of the signed informed consent was given to the subject and a copy was placed in the subject's medical record.   [name]

## 2024-01-03 NOTE — Discharge Instructions (Signed)

## 2024-01-03 NOTE — Op Note (Signed)
 Procedure: Electrical Cardioversion Indications:  Atrial Fibrillation  Procedure Details:  Consent: Risks of procedure as well as the alternatives and risks of each were explained to the (patient/caregiver).  Consent for procedure obtained.  Time Out: Verified patient identification, verified procedure, site/side was marked, verified correct patient position, special equipment/implants available, medications/allergies/relevent history reviewed, required imaging and test results available.  Performed  Patient placed on cardiac monitor, pulse oximetry, supplemental oxygen as necessary.  Sedation given: IV propofol  Pacer pads placed anterior and posterior chest.  Cardioverted 1 time(s).  Cardioversion with synchronized biphasic 200J shock.  Evaluation: Findings: Post procedure EKG shows: NSR Complications: None Patient did tolerate procedure well.  Time Spent Directly with the Patient:  30 minutes   Paul Sloan 01/03/2024, 7:43 AM

## 2024-01-03 NOTE — Transfer of Care (Signed)
 Immediate Anesthesia Transfer of Care Note  Patient: Joselito Fieldhouse Sohail  Procedure(s) Performed: CARDIOVERSION  Patient Location: PACU  Anesthesia Type:General  Level of Consciousness: awake, alert , and oriented  Airway & Oxygen Therapy: Patient Spontanous Breathing and Patient connected to nasal cannula oxygen  Post-op Assessment: Report given to RN and Post -op Vital signs reviewed and stable  Post vital signs: Reviewed and stable  Last Vitals:  Vitals Value Taken Time  BP 123/86 0736  Temp 97 0736  Pulse 64 0736  Resp 16 0736  SpO2 93 0736    Last Pain:  Vitals:   01/03/24 0645  TempSrc: Temporal         Complications: No notable events documented.

## 2024-01-03 NOTE — Anesthesia Postprocedure Evaluation (Signed)
 Anesthesia Post Note  Patient: Paul Sloan  Procedure(s) Performed: CARDIOVERSION     Patient location during evaluation: Cath Lab Anesthesia Type: General Level of consciousness: awake and alert Pain management: pain level controlled Vital Signs Assessment: post-procedure vital signs reviewed and stable Respiratory status: spontaneous breathing, nonlabored ventilation, respiratory function stable and patient connected to nasal cannula oxygen Cardiovascular status: stable and blood pressure returned to baseline Postop Assessment: no apparent nausea or vomiting Anesthetic complications: no   There were no known notable events for this encounter.  Last Vitals:  Vitals:   01/03/24 0745 01/03/24 0755  BP: 113/82 (!) 119/92  Pulse: 65 66  Resp: 20 15  Temp:    SpO2: 95% 96%    Last Pain:  Vitals:   01/03/24 0755  TempSrc:   PainSc: 0-No pain                 Garnette DELENA Gab

## 2024-01-03 NOTE — Anesthesia Postprocedure Evaluation (Signed)
 Anesthesia Post Note  Patient: Paul Sloan  Procedure(s) Performed: CARDIOVERSION     Patient location during evaluation: Cath Lab Anesthesia Type: General Level of consciousness: awake and alert Pain management: pain level controlled Vital Signs Assessment: post-procedure vital signs reviewed and stable Respiratory status: spontaneous breathing, nonlabored ventilation, respiratory function stable and patient connected to nasal cannula oxygen Cardiovascular status: blood pressure returned to baseline and stable Postop Assessment: no apparent nausea or vomiting Anesthetic complications: no   There were no known notable events for this encounter.  Last Vitals:  Vitals:   01/03/24 0745 01/03/24 0755  BP: 113/82 (!) 119/92  Pulse: 65 66  Resp: 20 15  Temp:    SpO2: 95% 96%    Last Pain:  Vitals:   01/03/24 0755  TempSrc:   PainSc: 0-No pain                 Garnette DELENA Gab

## 2024-01-03 NOTE — Interval H&P Note (Signed)
 History and Physical Interval Note:  01/03/2024 7:28 AM  Paul Sloan  has presented today for surgery, with the diagnosis of AFIB.  The various methods of treatment have been discussed with the patient and family. After consideration of risks, benefits and other options for treatment, the patient has consented to  Procedure(s): CARDIOVERSION (N/A) as a surgical intervention.  The patient's history has been reviewed, patient examined, no change in status, stable for surgery.  I have reviewed the patient's chart and labs.  Questions were answered to the patient's satisfaction.     Lillith Mcneff

## 2024-01-04 ENCOUNTER — Telehealth: Payer: Self-pay

## 2024-01-04 NOTE — Telephone Encounter (Signed)
-----   Message from Nurse Aldona R sent at 12/07/2023  9:28 AM EDT ----- Regarding: 12/16 Afib Mealor Important: list procedure date as first item in subject line, followed by procedure type (e.g., 10/26/23 AFib ablation)  Precert:  MD: Mealor Type of ablation: A-fib Diagnosis: Afib CPT code: A-fib (06343) Ablation scheduled (date/time): 12/16 730am  Procedure:  Added to calendar? Yes Orders entered? No, >30 days before procedure Letter complete? No, >30 days before procedure Scheduled with cath lab? Yes Any medications to hold? No Labs ordered (CBC, BMET, PT/INR if on warfarin): Yes Mapping system: CARTO (lab 4 or 6) CARTO/OPAL rep notified? Yes Cardiac CT needed? No Dye allergy? No Pre-meds ordered and instructions given? N/A Letter method: MyChart H&P: 10/24 Device: No  Follow-up:  Cassie/Angel, please schedule Routine.  Covering RN - please send this message to [employer], EP scheduler, EP Scheduling pool, EP [employer], and CT scheduler (Brittany Lynch/Stephanie Mogg), if indicated.

## 2024-01-07 ENCOUNTER — Other Ambulatory Visit: Payer: Self-pay | Admitting: Cardiology

## 2024-01-07 ENCOUNTER — Encounter (HOSPITAL_COMMUNITY): Payer: Self-pay

## 2024-01-07 ENCOUNTER — Other Ambulatory Visit (HOSPITAL_COMMUNITY): Payer: Self-pay

## 2024-01-07 DIAGNOSIS — I480 Paroxysmal atrial fibrillation: Secondary | ICD-10-CM

## 2024-01-07 MED ORDER — APIXABAN 5 MG PO TABS
5.0000 mg | ORAL_TABLET | Freq: Two times a day (BID) | ORAL | 3 refills | Status: AC
Start: 2024-01-07 — End: ?
  Filled 2024-01-07: qty 180, 90d supply, fill #0

## 2024-01-07 NOTE — Telephone Encounter (Signed)
 Eliquis  5mg  refill request received. Patient is 63 years old, weight-109.8kg, Crea-1.39 on 01/03/24, Diagnosis-Afib, and last seen by Dr. Nancey on 12/07/23. Dose is appropriate based on dosing criteria. Will send in refill to requested pharmacy.

## 2024-01-08 ENCOUNTER — Telehealth (HOSPITAL_COMMUNITY): Payer: Self-pay

## 2024-01-08 NOTE — Telephone Encounter (Signed)
 Spoke with patient to complete pre-procedure call.     Health status review:  Any new medical conditions, recent signs of acute illness or been started on antibiotics? No Any recent hospitalizations or surgeries? DCCV 11/20 Any new medications started since pre-op visit? No  Follow all medication instructions prior to procedure or the procedure may be rescheduled:    Continue taking Eliquis  (Apixaban ) twice daily without missing any doses before procedure. Essential chronic medications:  No medication should be continued, unless told otherwise. On the morning of your procedure DO NOT take any medication., including Eliquis  (Apixaban ).  Nothing to eat or drink after midnight prior to your procedure.  Pre-procedure testing scheduled: CT not required and lab work by December 2.  Confirmed patient is scheduled for Atrial Fibrillation Ablation on Tuesday, December 16 with Dr. Nancey. Instructed patient to arrive at the Main Entrance A at [facility]: [address] [place], [place] [address] and check in at Admitting at 5:30 AM.  Plan to go home the same day, you will only stay overnight if medically necessary. You MUST have a responsible adult to drive you home and MUST be with you the first 24 hours after you arrive home or your procedure could be cancelled.  Informed a nurse may call a day before the procedure to confirm arrival time and ensure instructions are followed.  Patient verbalized understanding to information provided and is agreeable to proceed with procedure.   Advised to contact RN Navigator at [phone], to inform of any new medications started after call or concerns prior to procedure.

## 2024-01-14 LAB — BASIC METABOLIC PANEL WITH GFR
BUN/Creatinine Ratio: 8 — ABNORMAL LOW (ref 10–24)
BUN: 9 mg/dL (ref 8–27)
CO2: 23 mmol/L (ref 20–29)
Calcium: 10.1 mg/dL (ref 8.6–10.2)
Chloride: 104 mmol/L (ref 96–106)
Creatinine, Ser: 1.13 mg/dL (ref 0.76–1.27)
Glucose: 99 mg/dL (ref 70–99)
Potassium: 4.5 mmol/L (ref 3.5–5.2)
Sodium: 138 mmol/L (ref 134–144)
eGFR: 72 mL/min/1.73 (ref >59)

## 2024-01-14 LAB — CBC
Hematocrit: 46.5 % (ref 37.5–51.0)
Hemoglobin: 15.3 g/dL (ref 13.0–17.7)
MCH: 31.7 pg (ref 26.6–33.0)
MCHC: 32.9 g/dL (ref 31.5–35.7)
MCV: 97 fL (ref 79–97)
Platelets: 233 x10E3/uL (ref 150–450)
RBC: 4.82 x10E6/uL (ref 4.14–5.80)
RDW: 11.8 % (ref 11.6–15.4)
WBC: 8.1 x10E3/uL (ref 3.4–10.8)

## 2024-01-15 ENCOUNTER — Ambulatory Visit: Payer: Self-pay | Admitting: Cardiovascular Disease

## 2024-01-22 ENCOUNTER — Encounter: Payer: Self-pay | Admitting: Emergency Medicine

## 2024-01-28 NOTE — Anesthesia Preprocedure Evaluation (Signed)
 Anesthesia Evaluation  Patient identified by MRN, date of birth, ID band Patient awake    Reviewed: Allergy & Precautions, NPO status , Patient's Chart, lab work & pertinent test results  History of Anesthesia Complications Negative for: history of anesthetic complications  Airway Mallampati: II  TM Distance: >3 FB Neck ROM: Full   Comment: ANTERIOR Dental no notable dental hx. (+) Dental Advisory Given, Teeth Intact, Chipped   Pulmonary neg pulmonary ROS   Pulmonary exam normal breath sounds clear to auscultation       Cardiovascular Exercise Tolerance: Good hypertension, Pt. on home beta blockers (-) angina + CAD  (-) Past MI + dysrhythmias (on apixiban) Atrial Fibrillation  Rhythm:Irregular Rate:Abnormal  ECHO 25'   1. Left ventricular ejection fraction, by estimation, is 60 to 65%. The  left ventricle has normal function. The left ventricle has no regional  wall motion abnormalities. Left ventricular diastolic function could not  be evaluated.   2. Right ventricular systolic function is normal. The right ventricular  size is normal.   3. The mitral valve is normal in structure. Trivial mitral valve  regurgitation. No evidence of mitral stenosis.   4. The aortic valve is tricuspid. There is mild calcification of the  aortic valve. Aortic valve regurgitation is not visualized. Aortic valve  sclerosis/calcification is present, without any evidence of aortic  stenosis.   5. Aortic dilatation noted. There is borderline dilatation of the  ascending aorta, measuring 38 mm.   6. The inferior vena cava is normal in size with greater than 50%  respiratory variability, suggesting right atrial pressure of 3 mmHg.     Neuro/Psych negative neurological ROS  negative psych ROS   GI/Hepatic ,neg GERD  ,,  Endo/Other  neg diabetes    Renal/GU   negative genitourinary   Musculoskeletal   Abdominal   Peds  Hematology    Anesthesia Other Findings   Reproductive/Obstetrics                              Anesthesia Physical Anesthesia Plan  ASA: 2  Anesthesia Plan: General   Post-op Pain Management: Minimal or no pain anticipated and Tylenol  PO (pre-op)*   Induction: Intravenous  PONV Risk Score and Plan: 2 and Treatment may vary due to age or medical condition, Ondansetron  and Dexamethasone   Airway Management Planned: Oral ETT  Additional Equipment: None  Intra-op Plan:   Post-operative Plan: Extubation in OR  Informed Consent: I have reviewed the patients History and Physical, chart, labs and discussed the procedure including the risks, benefits and alternatives for the proposed anesthesia with the patient or authorized representative who has indicated his/her understanding and acceptance.     Dental advisory given  Plan Discussed with: CRNA and Anesthesiologist  Anesthesia Plan Comments: (  )         Anesthesia Quick Evaluation

## 2024-01-28 NOTE — Pre-Procedure Instructions (Signed)
 Attempted to call patient regarding procedure instructions for tomorrow.  Left voicemail on the following items: Arrival time 0515 Nothing to eat or drink after midnight No meds AM of procedure Responsible person to drive you home and stay with you for 24 hrs  Have you missed any doses of anti-coagulant Eliquis - should be taken twice a day, if you have missed any doses please let us  know.  Don't take dose morning of procedure.

## 2024-01-29 ENCOUNTER — Ambulatory Visit (HOSPITAL_COMMUNITY): Payer: Self-pay | Admitting: Anesthesiology

## 2024-01-29 ENCOUNTER — Ambulatory Visit (HOSPITAL_BASED_OUTPATIENT_CLINIC_OR_DEPARTMENT_OTHER): Payer: Self-pay | Admitting: Anesthesiology

## 2024-01-29 ENCOUNTER — Ambulatory Visit (HOSPITAL_COMMUNITY)
Admission: RE | Admit: 2024-01-29 | Discharge: 2024-01-29 | Disposition: A | Attending: Cardiovascular Disease | Admitting: Cardiovascular Disease

## 2024-01-29 ENCOUNTER — Other Ambulatory Visit: Payer: Self-pay

## 2024-01-29 ENCOUNTER — Encounter (HOSPITAL_COMMUNITY): Payer: Self-pay | Admitting: Cardiovascular Disease

## 2024-01-29 ENCOUNTER — Encounter (HOSPITAL_COMMUNITY): Admission: RE | Disposition: A | Payer: Self-pay | Attending: Cardiovascular Disease

## 2024-01-29 DIAGNOSIS — I4891 Unspecified atrial fibrillation: Secondary | ICD-10-CM

## 2024-01-29 DIAGNOSIS — Z7901 Long term (current) use of anticoagulants: Secondary | ICD-10-CM | POA: Diagnosis not present

## 2024-01-29 DIAGNOSIS — E785 Hyperlipidemia, unspecified: Secondary | ICD-10-CM | POA: Diagnosis not present

## 2024-01-29 DIAGNOSIS — I4819 Other persistent atrial fibrillation: Secondary | ICD-10-CM

## 2024-01-29 DIAGNOSIS — I10 Essential (primary) hypertension: Secondary | ICD-10-CM | POA: Diagnosis not present

## 2024-01-29 DIAGNOSIS — I2510 Atherosclerotic heart disease of native coronary artery without angina pectoris: Secondary | ICD-10-CM | POA: Diagnosis not present

## 2024-01-29 DIAGNOSIS — D6869 Other thrombophilia: Secondary | ICD-10-CM | POA: Diagnosis not present

## 2024-01-29 HISTORY — PX: ATRIAL FIBRILLATION ABLATION: EP1191

## 2024-01-29 LAB — POCT ACTIVATED CLOTTING TIME: Activated Clotting Time: 281 s

## 2024-01-29 MED ORDER — PROPOFOL 10 MG/ML IV BOLUS
INTRAVENOUS | Status: DC | PRN
  Administered 2024-01-29: 08:00:00 150 mg via INTRAVENOUS

## 2024-01-29 MED ORDER — PHENYLEPHRINE 80 MCG/ML (10ML) SYRINGE FOR IV PUSH (FOR BLOOD PRESSURE SUPPORT)
PREFILLED_SYRINGE | INTRAVENOUS | Status: DC | PRN
  Administered 2024-01-29: 08:00:00 160 ug via INTRAVENOUS

## 2024-01-29 MED ORDER — SODIUM CHLORIDE 0.9% FLUSH: 3.0000 mL | Freq: Two times a day (BID) | INTRAVENOUS | Status: DC

## 2024-01-29 MED ORDER — SODIUM CHLORIDE 0.9 % IV SOLN: INTRAVENOUS | Status: DC

## 2024-01-29 MED ORDER — SUGAMMADEX SODIUM 200 MG/2ML IV SOLN
INTRAVENOUS | Status: DC | PRN
  Administered 2024-01-29: 09:00:00 200 mg via INTRAVENOUS

## 2024-01-29 MED ORDER — SODIUM CHLORIDE 0.9 % IV SOLN: 250.0000 mL | INTRAVENOUS | Status: DC | PRN

## 2024-01-29 MED ORDER — SODIUM CHLORIDE 0.9% FLUSH: 3.0000 mL | INTRAVENOUS | Status: DC | PRN

## 2024-01-29 MED ORDER — FENTANYL CITRATE (PF) 100 MCG/2ML IJ SOLN
INTRAMUSCULAR | Status: AC
  Filled 2024-01-29: qty 2

## 2024-01-29 MED ORDER — MIDAZOLAM HCL (PF) 2 MG/2ML IJ SOLN
INTRAMUSCULAR | Status: DC | PRN
  Administered 2024-01-29: 08:00:00 2 mg via INTRAVENOUS

## 2024-01-29 MED ORDER — PROTAMINE SULFATE 10 MG/ML IV SOLN
INTRAVENOUS | Status: DC | PRN
  Administered 2024-01-29: 09:00:00 50 mg via INTRAVENOUS

## 2024-01-29 MED ORDER — MIDAZOLAM HCL 2 MG/2ML IJ SOLN
INTRAMUSCULAR | Status: AC
  Filled 2024-01-29: qty 2

## 2024-01-29 MED ORDER — HEPARIN SODIUM (PORCINE) 1000 UNIT/ML IJ SOLN
INTRAMUSCULAR | Status: DC | PRN
  Administered 2024-01-29: 08:00:00 4000 [IU] via INTRAVENOUS
  Administered 2024-01-29: 08:00:00 19000 [IU] via INTRAVENOUS

## 2024-01-29 MED ORDER — ONDANSETRON HCL 4 MG/2ML IJ SOLN
INTRAMUSCULAR | Status: DC | PRN
  Administered 2024-01-29: 08:00:00 4 mg via INTRAVENOUS

## 2024-01-29 MED ORDER — ACETAMINOPHEN 325 MG PO TABS: 650.0000 mg | ORAL_TABLET | ORAL | Status: DC | PRN

## 2024-01-29 MED ORDER — PHENYLEPHRINE HCL-NACL 20-0.9 MG/250ML-% IV SOLN
INTRAVENOUS | Status: DC | PRN
  Administered 2024-01-29: 08:00:00 20 ug/min via INTRAVENOUS

## 2024-01-29 MED ORDER — PROPOFOL 500 MG/50ML IV EMUL
INTRAVENOUS | Status: DC | PRN
  Administered 2024-01-29: 08:00:00 25 ug/kg/min via INTRAVENOUS

## 2024-01-29 MED ORDER — LIDOCAINE 2% (20 MG/ML) 5 ML SYRINGE
INTRAMUSCULAR | Status: DC | PRN
  Administered 2024-01-29: 08:00:00 60 mg via INTRAVENOUS

## 2024-01-29 MED ORDER — ATROPINE SULFATE 1 MG/ML IV SOLN
INTRAVENOUS | Status: DC | PRN
  Administered 2024-01-29: 08:00:00 1 mg via INTRAVENOUS

## 2024-01-29 MED ORDER — HEPARIN (PORCINE) IN NACL 1000-0.9 UT/500ML-% IV SOLN
INTRAVENOUS | Status: DC | PRN
  Administered 2024-01-29 (×3): 500 mL

## 2024-01-29 MED ORDER — HEPARIN SODIUM (PORCINE) 1000 UNIT/ML IJ SOLN
INTRAMUSCULAR | Status: AC
  Filled 2024-01-29: qty 10

## 2024-01-29 MED ORDER — ONDANSETRON HCL 4 MG/2ML IJ SOLN: 4.0000 mg | Freq: Four times a day (QID) | INTRAMUSCULAR | Status: DC | PRN

## 2024-01-29 MED ORDER — ROCURONIUM BROMIDE 10 MG/ML (PF) SYRINGE
PREFILLED_SYRINGE | INTRAVENOUS | Status: DC | PRN
  Administered 2024-01-29: 08:00:00 20 mg via INTRAVENOUS
  Administered 2024-01-29: 08:00:00 70 mg via INTRAVENOUS

## 2024-01-29 MED ORDER — DEXAMETHASONE SOD PHOSPHATE PF 10 MG/ML IJ SOLN
INTRAMUSCULAR | Status: DC | PRN
  Administered 2024-01-29: 08:00:00 10 mg via INTRAVENOUS

## 2024-01-29 MED ORDER — FENTANYL CITRATE (PF) 100 MCG/2ML IJ SOLN
INTRAMUSCULAR | Status: DC | PRN
  Administered 2024-01-29: 08:00:00 100 ug via INTRAVENOUS

## 2024-01-29 MED ORDER — ACETAMINOPHEN 500 MG PO TABS
1000.0000 mg | ORAL_TABLET | Freq: Once | ORAL | Status: AC
  Administered 2024-01-29: 06:00:00 1000 mg via ORAL
  Filled 2024-01-29: qty 2

## 2024-01-29 NOTE — Progress Notes (Signed)
 Patient and wife was given discharge instructions. Both verbalized understanding.

## 2024-01-29 NOTE — Anesthesia Procedure Notes (Signed)
 Procedure Name: Intubation Date/Time: 01/29/2024 7:46 AM  Performed by: Elby Raelene SAUNDERS, CRNAPre-anesthesia Checklist: Patient identified, Emergency Drugs available, Suction available and Patient being monitored Patient Re-evaluated:Patient Re-evaluated prior to induction Oxygen Delivery Method: Circle System Utilized Preoxygenation: Pre-oxygenation with 100% oxygen Induction Type: IV induction Ventilation: Mask ventilation without difficulty Laryngoscope Size: Miller and 2 Grade View: Grade I Tube type: Oral Tube size: 7.5 mm Number of attempts: 1 Airway Equipment and Method: Stylet and Bite block Placement Confirmation: ETT inserted through vocal cords under direct vision, positive ETCO2 and breath sounds checked- equal and bilateral Secured at: 23 cm Tube secured with: Tape Dental Injury: Teeth and Oropharynx as per pre-operative assessment

## 2024-01-29 NOTE — Transfer of Care (Signed)
 Immediate Anesthesia Transfer of Care Note  Patient: Paul Sloan  Procedure(s) Performed: ATRIAL FIBRILLATION ABLATION  Patient Location: PACU  Anesthesia Type:General  Level of Consciousness: awake, patient cooperative, and responds to stimulation  Airway & Oxygen Therapy: Patient Spontanous Breathing and Patient connected to face mask oxygen  Post-op Assessment: Report given to RN and Post -op Vital signs reviewed and stable  Post vital signs: Reviewed and stable  Last Vitals:  Vitals Value Taken Time  BP 96/77 0902  Temp    Pulse 71 0902  Resp 18 0902  SpO2 100 0902    Last Pain:  Vitals:   01/29/24 0611  TempSrc: Oral  PainSc:          Complications: No notable events documented.

## 2024-01-29 NOTE — Discharge Instructions (Signed)

## 2024-01-29 NOTE — H&P (Signed)
 Electrophysiology Office Note:    Date:  01/29/2024   ID:  Paul Sloan [place], DOB 08/31/1960, MRN [id number]  PCP:  Charlott Dorn LABOR, MD   [facility] Providers Cardiologist:  Newman JINNY Lawrence, MD Electrophysiologist:  OLE ONEIDA HOLTS, MD     Referring MD: No ref. provider found   History of Present Illness:    Paul Sloan is a 63 y.o. male with a medical history significant for paroxysmal atrial fibrillation, hyperlipidemia referred for arrhythmia management.       History of Present Illness  The patient first saw Dr. Lawrence in May 2024 after experiencing several episodes of palpitations and fatigue and noted to have an irregular heart rate.  ZIO monitor placed by the patient's PCP showed atrial fibrillation, with less than 1% burden of arrhythmia.  He was managed with a rate control approach and continued to have episodes, which increased in duration and frequency.  ZIO monitor placed in August 2025 showed a 10% burden of atrial fibrillation, and the patient notes that he was doing fairly well during that time.  Now, he feels like he has been in atrial fibrillation persistently for the past several weeks.  He notices irregular heartbeats when he feels his pulse and feels fatigued and short of breath in atrial fibrillation.          I reviewed the patient's recent notes and labs. There was no LAA thrombus. he  has not missed any doses of anticoagulation, and he took his dose last night. There have been no changes in the patient's diagnoses, medications, or condition since our recent clinic visit.   Today, he is at baseline -with decreased energy levels and atrial fibrillation.  EKGs/Labs/Other Studies Reviewed Today:     Echocardiogram:  September 2025 LVEF 60 to 65%.  Normal valvular structure and function.  Ascending aorta measured 38 mm.   Monitors:  10 day monitor August 2025-- my interpretation Sinus rhythm predominates,  heart rate 48 to 107 bpm, average 73 bpm 10% burden of atrial fibrillation, heart rate 52 to 178 bpm, average 91 bpm Ectopy is rare. No symptoms reported  Stress testing:  Exercise treadmill stress May 2024 Patient exercised for 7 minutes 24 seconds, achieving 9.2 METS 87% of age-predicted maximum heart rate and met.  No ischemic changes on ECG  Advanced imaging:  Coronary calcium  score Total score 46.5   EKG:         Physical Exam:    VS:  BP 106/84   Pulse 67   Temp 98.2 F (36.8 C) (Oral)   Resp 20   Ht 6' 2 (1.88 m)   Wt 111.1 kg   SpO2 94%   BMI 31.46 kg/m     Wt Readings from Last 3 Encounters:  01/29/24 111.1 kg  01/03/24 109.8 kg  12/07/23 109.8 kg     GEN: Well nourished, well developed in no acute distress CARDIAC: iRRR, no murmurs, rubs, gallops RESPIRATORY:  Normal work of breathing MUSCULOSKELETAL: no edema    ASSESSMENT & PLAN:     Atrial fibrillation Now persistent He is symptomatic with fatigue, mild palpitations We discussed management options.  I explained the indication for and logistics of the ablation procedure.  I explained risks including bruising or bleeding at the access site, vascular injury, stroke, heart attack, need for surgery or prolonged hospital stay, risk of death.  I explained that the risk of major complications is very low but not zero.  He would like to  proceed with scheduling the procedure today. Start dronedarone  400 mg twice daily with meals Start Eliquis  5 mg p.o. twice daily Plan DC cardioversion pending ablation  Secondary hypercoagulable state Discontinue aspirin  81 mg Continue apixaban  5 mg p.o. twice daily  Possible sleep apnea Sleep study is pending   Signed, Eulas FORBES Furbish, MD  01/29/2024 7:15 AM    [facility]

## 2024-01-30 ENCOUNTER — Encounter (HOSPITAL_COMMUNITY): Payer: Self-pay | Admitting: Cardiovascular Disease

## 2024-01-30 ENCOUNTER — Telehealth (HOSPITAL_COMMUNITY): Payer: Self-pay

## 2024-01-30 NOTE — Anesthesia Postprocedure Evaluation (Signed)
 Anesthesia Post Note  Patient: Paul Sloan  Procedure(s) Performed: ATRIAL FIBRILLATION ABLATION     Patient location during evaluation: PACU Anesthesia Type: General Level of consciousness: awake and alert Pain management: pain level controlled Vital Signs Assessment: post-procedure vital signs reviewed and stable Respiratory status: spontaneous breathing, nonlabored ventilation, respiratory function stable and patient connected to nasal cannula oxygen Cardiovascular status: blood pressure returned to baseline and stable Postop Assessment: no apparent nausea or vomiting Anesthetic complications: no   No notable events documented.  Last Vitals:  Vitals:   01/29/24 1100 01/29/24 1200  BP: 105/68 102/70  Pulse: 72 74  Resp:    Temp:    SpO2: 93% 94%    Last Pain:  Vitals:   01/29/24 0930  TempSrc: Axillary  PainSc:    Pain Goal:                   [name]

## 2024-01-30 NOTE — Telephone Encounter (Signed)
 Spoke with patient to complete post procedure follow up call.  Patient reports no complications with groin sites.   Instructions reviewed with patient:  Remove large bandage at puncture site after 24 hours. It is normal to have bruising, tenderness, mild swelling, and a pea or marble sized lump/knot at the groin site which can take up to three months to resolve.  Get help right away if you notice sudden swelling at the puncture site.  Check your puncture site every day for signs of infection: fever, redness, swelling, pus drainage, warmth, foul odor or excessive pain. If this occurs, please call [phone], to speak with the RN Navigator. Get help right away if your puncture site is bleeding and the bleeding does not stop after applying firm pressure to the area.  You may continue to have skipped beats/ atrial fibrillation during the first several months after your procedure.  It is very important not to miss any doses of your blood thinner Eliquis .    You will follow up with the Afib clinic 4 weeks after your procedure and follow up with the APP 3 months after your procedure.  Activity restrictions reviewed.  Patient verbalized understanding to all instructions provided.

## 2024-02-25 NOTE — Progress Notes (Signed)
 "  Primary Care Physician: Paul Sloan LABOR, MD Primary Cardiologist: Paul JINNY Lawrence, MD Electrophysiologist: Paul ONEIDA HOLTS, MD (Inactive)  Referring Physician: Nancey Eulas BRAVO, MD    Paul Sloan is a 64 y.o. male with a history of paroxysmal AF s/p AF ablation by Dr. Nancey on 01/29/2024, mixed HLD, ascending aortic dilation who presents for follow up in the [facility].  The patient was initially diagnosed with atrial fibrillation in 2024 and was referred to Dr. Nancey for rhythm control options on 12/03/2023.  He was started on Multaq  and underwent DCCV prior to AF ablation on 01/03/2024 with conversion to sinus rhythm but had returned of atrial fibrillation prior to ablation.  He underwent successful AF ablation on 01/29/2024 with postprocedure DCCV to sinus rhythm and was continued on Multaq  postprocedure.  [name] presents today for post ablation follow-up.  On exam today he has been doing well with no recurrence of palpitations since his procedure.  EKG today shows sinus rhythm and patient has been compliant with his Eliquis  without any missed doses.  He is tolerating Multaq  without any GI upset and EKG intervals today are within normal limits for monitoring with medication.  During today's visit we discussed the importance of lifestyle modifications and preventing recurrence of atrial fibrillation.  He has resumed some of his normal activities postprocedure and was advised to return to normal activities at this time.  We discussed pursuing a sleep study and he is interested in doing so in the future and will discuss further at his 58-month follow-up.  Today, he denies symptoms of palpitations, chest pain, shortness of breath, orthopnea, PND, lower extremity edema, dizziness, presyncope, syncope, snoring, daytime somnolence, bleeding, or neurologic sequela. The patient is tolerating medications without difficulties and is otherwise without  complaint today.    Atrial Fibrillation Management history: History of Sleep Apnea (pending sleep study) Previous antiarrhythmic drugs: Multaq  Previous cardioversions: 01/03/2024 Previous ablations: 01/2024 Anticoagulation history: Eliquis   ROS- All systems are reviewed and negative except as per the HPI above.  Past Medical History:  Diagnosis Date   Diverticulitis of colon with perforation    Sinusitis     Current Outpatient Medications  Medication Sig Dispense Refill   apixaban  (ELIQUIS ) 5 MG TABS tablet Take 1 tablet (5 mg total) by mouth 2 (two) times daily.     apixaban  (ELIQUIS ) 5 MG TABS tablet Take 1 tablet (5 mg total) by mouth 2 (two) times daily. 180 tablet 3   Cholecalciferol (VITAMIN D3 PO) Take 1 tablet by mouth daily at 12 noon.     dronedarone  (MULTAQ ) 400 MG tablet Take 1 tablet (400 mg total) by mouth 2 (two) times daily with a meal. 60 tablet 11   ibuprofen  (ADVIL ,MOTRIN ) 200 MG tablet Take 200 mg by mouth every 8 (eight) hours as needed. For pain.     metoprolol  tartrate (LOPRESSOR ) 25 MG tablet Take 1 tablet (25 mg total) by mouth 2 (two) times daily. 180 tablet 3   Multiple Vitamin (MULTIVITAMIN) tablet Take 1 tablet by mouth daily.     Naphazoline-Pheniramine (VISINE OP) Place 1 drop into both eyes daily as needed (redness;dry;irritation).     rosuvastatin  (CRESTOR ) 10 MG tablet Take 1 tablet (10 mg total) by mouth daily. 90 tablet 3   sildenafil (VIAGRA) 50 MG tablet Take 50 mg by mouth daily as needed for erectile dysfunction.     No current facility-administered medications for this visit.    Physical Exam: There were  no vitals taken for this visit.  GEN: Well nourished, well developed in no acute distress NECK: No JVD; No carotid bruits CARDIAC: Regular rate and rhythm, no murmurs, rubs, gallops RESPIRATORY:  Clear to auscultation without rales, wheezing or rhonchi  ABDOMEN: Soft, non-tender, non-distended EXTREMITIES:  No edema; No deformity    Wt Readings from Last 3 Encounters:  01/29/24 111.1 kg  01/03/24 109.8 kg  12/07/23 109.8 kg     EKG today demonstrates:   EKG Interpretation Date/Time:    Ventricular Rate:    PR Interval:    QRS Duration:    QT Interval:    QTC Calculation:   R Axis:      Text Interpretation:          Echo Completed 11/08/2023:  1. Left ventricular ejection fraction, by estimation, is 60 to 65%. The  left ventricle has normal function. The left ventricle has no regional  wall motion abnormalities. Left ventricular diastolic function could not  be evaluated.   2. Right ventricular systolic function is normal. The right ventricular  size is normal.   3. The mitral valve is normal in structure. Trivial mitral valve  regurgitation. No evidence of mitral stenosis.   4. The aortic valve is tricuspid. There is mild calcification of the  aortic valve. Aortic valve regurgitation is not visualized. Aortic valve  sclerosis/calcification is present, without any evidence of aortic  stenosis.   5. Aortic dilatation noted. There is borderline dilatation of the  ascending aorta, measuring 38 mm.   6. The inferior vena cava is normal in size with greater than 50%  respiratory variability, suggesting right atrial pressure of 3 mmHg.   CHA2DS2-VASc Score = 1  The patient's score is based upon: CHF History: 0 HTN History: 0 Diabetes History: 0 Stroke History: 0 Vascular Disease History: 0 Age Score: 1 Gender Score: 0       ASSESSMENT AND PLAN: Persistent Atrial Fibrillation (ICD10:  I48.19) The patient's CHA2DS2-VASc score is 1, indicating a 0.6% annual risk of stroke.  - -s/p initiation of Multaq  and DCCV on 12/2023 with pulsed field ablation completed 01/2024 - Today patient is doing well and tolerating Multaq  without any adverse reactions.  He is maintaining sinus rhythm post ablation and reports no groin site complications. -Discussed pursuing sleep study and patient is interested in  discussing at his next follow-up -Continue Eliquis  5 mg twice daily - Continue Multaq  400 mg twice daily  Secondary hypercoagulable state: - Continue Eliquis  5 mg twice daily   High Risk Medication Monitoring (ICD 10: Z79.899) Patient requires ongoing monitoring for anti-arrhythmic medication which has the potential to cause life threatening arrhythmias. Intervals on ECG acceptable for dronedarone  monitoring.       Signed,  Wyn Raddle, Jackee Shove, NP    02/25/2024 8:53 AM     Follow up with the AF Clinic in as scheduled  "

## 2024-02-26 ENCOUNTER — Ambulatory Visit (HOSPITAL_COMMUNITY)
Admission: RE | Admit: 2024-02-26 | Discharge: 2024-02-26 | Disposition: A | Discharge status: Home / Self Care | Source: Ambulatory Visit | Attending: Nurse Practitioner | Admitting: Nurse Practitioner

## 2024-02-26 ENCOUNTER — Encounter (HOSPITAL_COMMUNITY): Payer: Self-pay | Admitting: Nurse Practitioner

## 2024-02-26 VITALS — BP 114/66 | HR 66 | Ht 74.0 in | Wt 254.4 lb

## 2024-02-26 DIAGNOSIS — Z5181 Encounter for therapeutic drug level monitoring: Secondary | ICD-10-CM

## 2024-02-26 DIAGNOSIS — I4819 Other persistent atrial fibrillation: Secondary | ICD-10-CM | POA: Diagnosis not present

## 2024-02-26 DIAGNOSIS — D6859 Other primary thrombophilia: Secondary | ICD-10-CM

## 2024-02-26 DIAGNOSIS — Z79899 Other long term (current) drug therapy: Secondary | ICD-10-CM

## 2024-04-29 ENCOUNTER — Ambulatory Visit: Admitting: Physician Assistant
